# Patient Record
Sex: Male | Born: 1955 | Race: White | Hispanic: No | Marital: Married | Smoking: Never smoker
Health system: Southern US, Community
[De-identification: ages and names within clinical notes are randomized; demographics above are authoritative.]

## PROBLEM LIST (undated history)

## (undated) DIAGNOSIS — F419 Anxiety disorder, unspecified: Secondary | ICD-10-CM

## (undated) DIAGNOSIS — I739 Peripheral vascular disease, unspecified: Secondary | ICD-10-CM

## (undated) DIAGNOSIS — R609 Edema, unspecified: Secondary | ICD-10-CM

## (undated) DIAGNOSIS — E039 Hypothyroidism, unspecified: Secondary | ICD-10-CM

## (undated) DIAGNOSIS — E669 Obesity, unspecified: Secondary | ICD-10-CM

## (undated) DIAGNOSIS — M1990 Unspecified osteoarthritis, unspecified site: Secondary | ICD-10-CM

## (undated) DIAGNOSIS — E119 Type 2 diabetes mellitus without complications: Secondary | ICD-10-CM

## (undated) HISTORY — DX: Obesity, unspecified: E66.9

## (undated) HISTORY — DX: Type 2 diabetes mellitus without complications: E11.9

## (undated) HISTORY — PX: BARIATRIC SURGERY: SHX1103

## (undated) HISTORY — PX: ABDOMINOPLASTY/PANNICULECTOMY: SHX5578

## (undated) HISTORY — DX: Unspecified osteoarthritis, unspecified site: M19.90

## (undated) HISTORY — DX: Edema, unspecified: R60.9

## (undated) HISTORY — DX: Peripheral vascular disease, unspecified: I73.9

---

## 2000-11-26 ENCOUNTER — Encounter: Admission: RE | Admit: 2000-11-26 | Discharge: 2001-02-24 | Payer: Self-pay | Admitting: Pulmonary Disease

## 2003-08-16 ENCOUNTER — Inpatient Hospital Stay (HOSPITAL_COMMUNITY): Admission: EM | Admit: 2003-08-16 | Discharge: 2003-08-20 | Payer: Self-pay | Admitting: Emergency Medicine

## 2004-01-18 ENCOUNTER — Ambulatory Visit (HOSPITAL_COMMUNITY): Admission: RE | Admit: 2004-01-18 | Discharge: 2004-01-18 | Payer: Self-pay | Admitting: Surgical Oncology

## 2006-03-07 ENCOUNTER — Ambulatory Visit (HOSPITAL_COMMUNITY): Admission: RE | Admit: 2006-03-07 | Discharge: 2006-03-07 | Payer: Self-pay | Admitting: Pulmonary Disease

## 2006-06-11 ENCOUNTER — Ambulatory Visit (HOSPITAL_COMMUNITY): Admission: RE | Admit: 2006-06-11 | Discharge: 2006-06-11 | Payer: Self-pay | Admitting: Pulmonary Disease

## 2006-07-09 ENCOUNTER — Encounter: Admission: RE | Admit: 2006-07-09 | Discharge: 2006-07-09 | Payer: Self-pay | Admitting: General Surgery

## 2007-08-06 ENCOUNTER — Encounter: Admission: RE | Admit: 2007-08-06 | Discharge: 2007-08-06 | Payer: Self-pay | Admitting: General Surgery

## 2007-10-27 ENCOUNTER — Encounter (INDEPENDENT_AMBULATORY_CARE_PROVIDER_SITE_OTHER): Payer: Self-pay | Admitting: General Surgery

## 2007-10-27 ENCOUNTER — Ambulatory Visit (HOSPITAL_COMMUNITY): Admission: RE | Admit: 2007-10-27 | Discharge: 2007-10-27 | Payer: Self-pay | Admitting: General Surgery

## 2010-03-26 ENCOUNTER — Encounter: Payer: Self-pay | Admitting: *Deleted

## 2010-07-18 NOTE — Op Note (Signed)
[name], [name]            ACCOUNT NO.:  [id number]   MEDICAL RECORD NO.:  [id number]          PATIENT TYPE:  AMB   LOCATION:  SDS                          FACILITY:  [facility]   PHYSICIAN:  Gabrielle Dare. Janee Morn, M.D.DATE OF BIRTH:  06/26/1955   DATE OF PROCEDURE:  10/27/2007  DATE OF DISCHARGE:  10/27/2007                               OPERATIVE REPORT   PREOPERATIVE DIAGNOSIS:  Chronic abdominal wound, status post recurrent  ventral hernia repair with mesh.   POSTOPERATIVE DIAGNOSES:  1. Chronic abdominal wound status post recurrent ventral hernia repair      with mesh.  2. 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   PROCEDURE:  Incision and drainage and debridement of chronic abdominal  wound 6 sections with cultures taken.   SURGEON:  Gabrielle Dare. Janee Morn, MD   ANESTHESIA:  General.   HISTORY OF PRESENT ILLNESS:  [name] is 55 year old gentleman who  have been following in the office for a chronic seroma after abdominal  hernia repair with mesh.  He has a history of a gastric bypass surgery  done at an outside location followed by panniculectomy and abdominal  incisional hernia repair.  He then had repair of his recurrent hernia  with mesh.  He has had chronic draining cerumen.  He presents today for  abdominal wound exploration and debridement.   PROCEDURE IN DETAIL:  Informed consent was obtained.  The patient was  identified in the preop holding area.  Of note, he had developed two  more places on the right lower quadrant with more induration and he and  his wife have circled 6 areas on his anterior abdominal wall of concern.  This is 5 areas in addition to his main chronic sinus that I have been  following.  He received intravenous antibiotics.  He was brought to the  operating room.  General anesthesia was administered by the anesthesia  staff.  His abdomen was prepped and draped in sterile fashion.  We  started at his right lateral most wounds and this had some  palpable  indurated area.  A transverse incision was made over this.  There was  minimal fluid and there appeared to be some suture granuloma extending  down to the scar tissue above this mesh.  This was minimally debrided  and hemostasis was obtained with Bovie cautery and moved onto the next  area which was more erythematous and indurated.  This area was excised  down in elliptical fashion with underlying induration excised down using  Bovie cautery.  This was sent to pathology.  This was also consistent  with a chronic suture granuloma.  There was no significant drainage and  it appeared benign where it extended down to the scar tissue overlying  the mesh.  None of the mesh was exposed.  The wound was copiously  irrigated and hemostasis was obtained with Bovie cautery.  Next, he had  a small palpable area cephalad and just lateral to his main draining  infraumbilical region and again his umbilicus has been removed.  This  area was opened with suture and there was a large vein with  some chronic  inflammation.  The vein was controlled with suture ligation with 2-0  Vicryl and the area was debrided.  Next, his chronic draining sinus was  evaluated and it had some cloudy fluid.  This was sent for anaerobic and  aerobic cultures.  The area was then widely excised with an elliptical  excision of tissue and this entered down into some more hardened scar  tissue extending down towards the mesh but then seemed to stop above the  mesh.  The mesh was not exposed.  This area was debrided of all this  abnormal tissue and this was sent to pathology for evaluation.  Next, on  the left side of the wound, there was a small palpable area.  This was  incised and again there was a large vein with some surrounding small  scar tissue.  The vein was controlled with suture ligation as previously  and there was no further evidence of infection or other abnormality.  Next, more lateral on the left was another  area of some induration.  This was similarly incised and debrided of some scar tissue and likely a  stitch granuloma.  All wounds were copiously irrigated.  Hemostasis was  again ensured with Bovie cautery.  The wounds were then packed with  saline-soaked gauze and wet-to-dry dressings which the patient feels  comfortable doing with his wife at home.  He tolerated the procedure  well.  Sponge, needle, and instrument counts were all correct.  He was  taken to recovery room in stable condition.  There were no apparent  complications.      Gabrielle Dare Janee Morn, M.D.  Electronically Signed     BET/MEDQ  D:  10/27/2007  T:  10/27/2007  Job:  [id number]   cc:   Ramon Dredge L. Juanetta Gosling, M.D.

## 2010-07-21 NOTE — Group Therapy Note (Signed)
NAME:  [name], [name]                      ACCOUNT NO.:  [id number]   MEDICAL RECORD NO.:  [id number]                   PATIENT TYPE:  INP   LOCATION:  A227                                 FACILITY:  [facility]   PHYSICIAN:  Edward L. Juanetta Gosling, M.D.             DATE OF BIRTH:  05/02/1955   DATE OF PROCEDURE:  DATE OF DISCHARGE:  08/20/2003                                   PROGRESS NOTE   PROBLEM:  1. Proteus urinary tract infection.  2. Status post gastric bypass.  3. Diabetes.   SUBJECTIVE:  Mr. Dupree says that he feels much better.  He had a little  bit of fever yesterday afternoon, but says he is much better now and he  really would like to go home.   PHYSICAL EXAMINATION:  VITAL SIGNS:  Temperature 98.4; pulse 71;  respirations 20; blood sugar 108; blood pressure 127/85.  CHEST:  Clear.  ABDOMEN:  Soft.   ASSESSMENT:  I am going to try to evaluate the situation with Cipro in  patients with gastric bypass.  If it is well absorbed, I will plan to go  ahead and let him go home later today.      ___________________________________________                                            Oneal Deputy. Juanetta Gosling, M.D.   ELH/MEDQ  D:  08/20/2003  T:  08/20/2003  Job:  [id number]

## 2010-07-21 NOTE — Procedures (Signed)
NAME:  [name], [name]            ACCOUNT NO.:  [id number]   MEDICAL RECORD NO.:  [id number]          PATIENT TYPE:  OUT   LOCATION:  RESP                          FACILITY:  [facility]   PHYSICIAN:  Edward L. Juanetta Gosling, M.D.DATE OF BIRTH:  22-Feb-1956   DATE OF PROCEDURE:  06/11/2006  DATE OF DISCHARGE:                            PULMONARY FUNCTION TEST   1. Spirometry shows new no evidence of a ventilatory defect and no      airflow obstruction.  2. Lung volumes are normal.  3. DLCO is mildly reduced.  4. Arterial blood gases are normal.      Edward L. Juanetta Gosling, M.D.  Electronically Signed     ELH/MEDQ  D:  06/12/2006  T:  06/12/2006  Job:  [id number]

## 2010-07-21 NOTE — Procedures (Signed)
NAME:  [name], [name]                      ACCOUNT NO.:  [id number]   MEDICAL RECORD NO.:  [id number]                   PATIENT TYPE:  INP   LOCATION:  A227                                 FACILITY:  [facility]   PHYSICIAN:  Willa Rough, M.D.                  DATE OF BIRTH:  [birth year]   DATE OF PROCEDURE:  DATE OF DISCHARGE:                                  ECHOCARDIOGRAM   The patient has had syncope and this study is done for further evaluation.   2-D ECHOCARDIOGRAM:  Aorta:  35 mm.  Aortic valve:  Normal.  Left atrium:  40 mm.  Mitral valve:  Normal.  Left ventricle:  End-diastolic dimension 43  mm and systolic dimension 30 mm.  Wall thickness is increased at 14 mm.  Wall motion is normal.  The ejection fraction is 60%.  Right ventricle:  Normal.  Tricuspid valve:  Normal.  Pulmonic valve:  Not well seen.  Pericardia effusion:  There is no significant pericardial effusion.   Doppler analysis revealed no significant abnormalities.   IMPRESSION:  This study shows that there is mild to moderate left  ventricular hypertrophy.  There is normal overall wall motion.  There are no  significant valvular abnormalities.      ___________________________________________                                            Willa Rough, M.D.   JK/MEDQ  D:  08/18/2003  T:  08/18/2003  Job:  [id number]   cc:   [facility]

## 2010-07-21 NOTE — Group Therapy Note (Signed)
NAME:  BOWIE, DELIA                      ACCOUNT NO.:  [id number]   MEDICAL RECORD NO.:  [id number]                   PATIENT TYPE:  INP   LOCATION:  A227                                 FACILITY:  [facility]   PHYSICIAN:  Edward L. Juanetta Gosling, M.D.             DATE OF BIRTH:  [birth year]   DATE OF PROCEDURE:  DATE OF DISCHARGE:                                   PROGRESS NOTE   SUBJECTIVE:  Mr. Barbier has had more problems with fever overnight.  His  temperature has been as high as 102.  He has had some chills.   OBJECTIVE:  VITAL SIGNS:  Temperature now is 101.3, pulse 95, respirations  20, blood sugar 125, blood pressure 115/68.  CHEST:  Fairly clear.  HEART:  Regular.  GENERAL:  He has no other symptoms.  ABDOMEN:  His abdominal wound looks pretty good.  I do not think it is the  source of fever.   LABORATORY DATA:  His blood cultures are both negative so far.  Urine  culture is pending.   PLAN:  We will plan to continue treatments.  He was scheduled for a  Cardiolite stress test today.  I am going to cancel that.      ___________________________________________                                            Oneal Deputy. Juanetta Gosling, M.D.   ELH/MEDQ  D:  08/18/2003  T:  08/18/2003  Job:  [id number]

## 2010-07-21 NOTE — Discharge Summary (Signed)
NAME:  [name], [name]                      ACCOUNT NO.:  [id number]   MEDICAL RECORD NO.:  [id number]                   PATIENT TYPE:  INP   LOCATION:  A227                                 FACILITY:  [facility]   PHYSICIAN:  Edward L. Juanetta Gosling, M.D.             DATE OF BIRTH:  [birth year]   DATE OF ADMISSION:  08/16/2003  DATE OF DISCHARGE:                                 DISCHARGE SUMMARY   DISCHARGE DIAGNOSES:  1. Proteus mirabilis urinary tract infection.  2. Syncope secondary to #1.  3. Status post gastric bypass surgery.  4. Hypokalemia.  5. Diabetes.  6. Depression.  7. Recent abdominal surgery with slow healing.   HISTORY:  [name] has undergone a gastric bypass surgery and has lost  well over 100 pounds.  He was in his usual state of fairly good health after  that.  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  He developed, however, a sensation of frequency of urination on  the day prior to admission, and then he had a syncopal episode. He was  brought to the emergency room where he was noted to have an elevated white  count in his urine, appeared to have a urinary tract infection and was  admitted because of that.   PHYSICAL EXAMINATION:  GENERAL:  His physical examination shows a well-  developed, well-nourished male, in no acute distress.  VITAL SIGNS: Temperature 101.6, pulse 107, respirations 20, blood pressure  122/67.  Height 69 inches.  Weight 245.  HEENT:  Pupils react to light and accommodation.  Nose and throat are clear.  CHEST:  Fairly clear.  HEART:  Regular.  ABDOMEN:  He has an abdominal surgery scar on the right side with an open  area.  NEUROLOGIC:  He is intact.   LABORATORY DATA:  White count 12,600, hemoglobin 15.1, BUN 9, creatinine  1.1, potassium 3.4.  Urine showed 11-20 white blood cells and many bacteria,  many red cells.   HOSPITAL COURSE:  It was felt that he had syncope from vagal reaction from  his  urinary tract infection.  However, he did have cardiology consultation  because of concerns.  He was to undergo a Cardiolite stress test, but his  temperature went back up to about 102.  The stress test was cancelled at  that point.  It is going to be done as an outpatient.  He got better over  the next several days. He was placed on Cipro intravenously and had fairly  slow  resolution of his temperature.  By the time of discharge, he was much  improved.  He is discharge home on Cipro 750 mg b.i.d. x10 days.  He is  going to use Tylenol and Ibuprofen alternating for fever, and he is going to  follow up in my office.     ___________________________________________  Edward L. Juanetta Gosling, M.D.   ELH/MEDQ  D:  08/20/2003  T:  08/21/2003  Job:  [id number]

## 2010-07-21 NOTE — Consult Note (Signed)
NAME:  [name], [name]                      ACCOUNT NO.:  [id number]   MEDICAL RECORD NO.:  [id number]                   PATIENT TYPE:  INP   LOCATION:  A227                                 FACILITY:  [facility]   PHYSICIAN:  Cecil Cranker, M.D.             DATE OF BIRTH:  [birth year]   DATE OF CONSULTATION:  08/17/2003  DATE OF DISCHARGE:                                   CONSULTATION   PRIMARY CARE PHYSICIAN:  Dr. Shaune Pollack.   CHIEF COMPLAINT:  Syncope.   HISTORY OF PRESENT ILLNESS:  Mr. Bautch is a 55 year old gentleman with  past medical history significant for diabetes mellitus and obesity status  post gastric bypass surgery. He presented to the hospital after a syncopal  episode. He states that while taking a shower he had a near syncopal episode  proceeded by flushed and diaphoretic feeling. He noted no chest pain or  shortness of breath. This resolved quickly. He stood back up in the shower  and shortly after that had a late syncope episode and woke up in the  bathroom floor. He is unclear how long he out or if he hit his head on the  floor or not. He assumes that he did. He states over the past several days  he has felt somewhat more fatigued than usual. He also noted the day of the  syncope episode to have increased urinary frequency where he got out of the  bed approximately seven times to use the restroom. He denied any dysuria or  hematuria or urgency. 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 He called his  wife. She was on duty. She came over to evaluate him. He had another episode  of diaphoresis and flushed feeling, and his heart rate was noted to drop  from the 110s to the 50s, and his blood pressure was 80 systolic. With this  episode, this resolved upon lying supine. He denies any previous cardiac  evaluation or history.   PAST MEDICAL HISTORY:  1. Diabetes mellitus, prior to gastric bypass controlled with oral     medication, post bypass surgery diet  controlled.  2. Obesity status post gastric bypass in November 2002 at [facility]  3. Status post hernia repair in April of 2005.  4. Status post cholecystectomy in November 2002.  5. Hypertension, resolved post surgery.  6. Depression which resolved post bypass surgery.   MEDICATIONS:  Medications prior to admission:  1. Multivitamin daily.  2. Potassium supplement, Micro-K 40 mEq q.d. (patient states he has not     taken this in one week).   Medications in the hospital include:  1. Cipro 400 mg IV q.12h.  2. KCl 20 mEq t.i.d.  3. Normal saline at 125 cc per hour.   ALLERGIES:  No known drug allergies.   SOCIAL HISTORY:  Mr. Marcucci lives in [place] with his wife. He has  three adopted children. He  works as an Advertising copywriter. He denies any history  of tobacco or alcohol abuse. Denies any illicit drug use.   FAMILY HISTORY:  Mother deceased at 85 years ago secondary to a ventricular  blow out. She also had a history of rheumatoid arthritis and embolic CVA.  Father deceased at 75 years old secondary to emphysema. He did have a  history of a cardiomyopathy, unclear of all the details from this. He has  one brother who was living with no known coronary disease.   REVIEW OF SYSTEMS:  CONSTITUTIONAL:  Positive for fever upon admission. The  patient denies any chills. He did have some diaphoresis with this episode.  HEENT:  No headaches. No vertigo or lightheadedness. No changes in his  vision or hearing. He does wear reading glasses. SKIN:  No rashes or lesions  have been noted. CARDIOPULMONARY:  No chest pain or shortness of breath. No  dyspnea on exertion. No orthopnea. No PND. Occasional pedal edema and  pre/syncope per HPI. GENITOURINARY:  Frequency as noted above. No hematuria  or dysuria. NEUROPSYCH:  Generalized weakness. No numbness. No depression or  anxiety. MUSCULOSKELETAL:  No muscle aches or joint aches. GASTROINTESTINAL:  No nausea, vomiting. No bright red  blood per rectum or melena noted. No  abdominal pain or GERD symptoms. All other systems were negative as reviewed  except per HPI.   PHYSICAL EXAMINATION:  VITAL SIGNS:  Temperature 101.8, pulse 103,  respirations 20, blood pressure 118/69, weight 245 pounds. Telemetry:  Sinus  tachycardia in the 110s with no events noted.  GENERAL:  Well-developed, well-nourished male in no acute distress with some  sweating.  HEENT:  Normocephalic, atraumatic. Pupils are equal, round, and reactive to  light. Sclerae are clear.  NECK:  Reveals no jugular venous distention and no carotid bruits. No  lymphadenopathy is noted.  CARDIOVASCULAR:  Tachycardic, regular rhythm, positive S4, no murmurs are  noted. Femoral pulses intact bilaterally with no bruits.  LUNGS:  Clear to auscultation bilaterally without wheezes, rales, or  rhonchi.  SKIN: No rashes are noted on skin.  ABDOMEN:  Obese, soft, nontender with active bowel sounds. No  hepatosplenomegaly is noted.  GENITOURINARY/RECTAL:  Deferred.  EXTREMITIES:  No clubbing, cyanosis, or edema is noted. He does have intact  pulses bilaterally.  MUSCULOSKELETAL:  No joint deformity or effusions are noted.  NEUROLOGICAL:  He is alert and oriented x3. Cranial nerves exam is grossly  intact.   LABORATORY DATA:  White blood cells 12.6, hemoglobin 15.1, hematocrit 43.4,  platelets 135. Does have a left shift. Sodium 135, potassium 3.4, chloride  101, CO2 28, BUN 9, creatinine 1.1, glucose 150. He had one set of point of  care markers that were negative x1 as far as his myoglobin and troponin.  Calcium was 8.6. Urinalysis revealed urinary tract infection. Chest x-ray  was no active disease per ER report. Electrocardiogram reveals sinus  tachycardia at 102 beats per minute with left axis deviation, normal PR  interval, QRS duration and QTC. He did have some minimal ST depression in  lead II, otherwise nonspecific changes. No hypertrophy is  noted.  IMPRESSION AND PLAN:  1. Syncope of unclear etiology. Could be a vasovagal etiology secondary to     his urinary tract infection or his diabetes and low potassium level. Will     further evaluate for cardiac etiology with a 2-D echocardiogram and     adenosine Cardiolite. Will continue to be monitored on telemetry. We will  also check some orthostatic vital signs as well.  2. Hypokalemia. Replacement per primary team.  3. Diabetes mellitus. Accu-Cheks and management per primary team.  4. Urinary tract infection, currently treated with Cipro per the primary     team.   The patient was interviewed and examined by Dr. Glennon Hamilton. He agrees with  the above assessment and plan. We appreciate this consult and will be happy  to follow along with you during this patient's hospitalization.     ________________________________________  ___________________________________________  Jae Dire, P.A. [facility]                      Cecil Cranker, M.D.   AB/MEDQ  D:  08/17/2003  T:  08/17/2003  Job:  [id number]

## 2010-07-21 NOTE — H&P (Signed)
NAME:  [name], [name]                      ACCOUNT NO.:  [id number]   MEDICAL RECORD NO.:  [id number]                   PATIENT TYPE:  INP   LOCATION:  A227                                 FACILITY:  [facility]   PHYSICIAN:  Edward L. Juanetta Gosling, M.D.             DATE OF BIRTH:  April 25, 1955   DATE OF ADMISSION:  08/16/2003  DATE OF DISCHARGE:                                HISTORY & PHYSICAL   REASON FOR ADMISSION:  Syncope.   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  He says that he had been doing  fairly well.  He had been working a lot, but had woke up several times  during the night on the night of admission to go to the bathroom.  He did  not have any other urinary symptoms other than frequency.  He had no  dysuria.  He was taking a shower.  He had a near syncopal episode, and then  he had another episode where he did pass out.  He called his wife who is  also an EMT.  He and her partner who were working came to the house, and he  was noted to be somewhat tachycardic, but then his heart rate dropped and  his blood pressure dropped.  He became diaphoretic and said he did not feel  well, and he was brought to the emergency room for evaluation.   PAST MEDICAL HISTORY:  Significant for obesity surgery.  He has noninsulin  dependent diabetes.  He has had a hernia repair done about three months ago.   SOCIAL HISTORY:  He works, as mentioned, as a Radiation protection practitioner.  He does not smoke.  He does not drink any alcohol.  He is married and lives at home with his  wife.   REVIEW OF SYSTEMS:  Except as mentioned, he has lost weight.  He does have  an ulcerated area on his lower abdomen related to his hernia surgery, but  otherwise is essentially negative.  He did not know of any fever, but he did  have fever when he came to the emergency room.  He had no chest pain with  this episode.  No shortness of breath.  No cough, no  sputum production.   PHYSICAL EXAMINATION:  GENERAL:  A well-developed, well-nourished male who  is in no acute distress now.  VITAL SIGNS:  Temperature 101.6, pulse 107, respirations 20, blood pressure  122/67.  Height 69 inches.  Weight 245.  HEENT:  His pupils are reactive to light and accommodation.  Nose and throat  are clear.  CHEST:  Fairly clear without wheezes.  HEART:  Regular.  ABDOMEN:  Soft.  He has a recent abdominal surgical scar on the right side.  He has an open area.  NEUROLOGIC:  CNS now is grossly intact with no focal abnormalities.   LABORATORY DATA:  White count 12,600, hemoglobin 15.1, platelets 135,  glucose 150, BUN 9, creatinine 1.1.  Potassium was 3.4.  He does say that he  has not been taking his potassium for the last week or so.  Additional  cardiac markers are negative.  Urinalysis shows trace ketones, small amount  of blood, negative nitrite, small amount of leukocytes, 11-20 white cells  per high-powered field.  Many bacteria, many red cells.  Culture, of course,  is pending.   ASSESSMENT:  He has syncope.  I am concerned that he did have what appeared  to be a fairly severe vagal reaction, and has a urinary tract infection. I  am somewhat surprised that he has had syncope from this.  With his changes  in heart rate, etc., I am going to go ahead and ask for a [facility]  Consultation, and I am going to continue with his other treatments in the  meantime.     ___________________________________________                                         Oneal Deputy Juanetta Gosling, M.D.   ELH/MEDQ  D:  08/17/2003  T:  08/17/2003  Job:  [id number]

## 2010-07-21 NOTE — Group Therapy Note (Signed)
NAME:  [name], [name]                      ACCOUNT NO.:  [id number]   MEDICAL RECORD NO.:  [id number]                   PATIENT TYPE:  INP   LOCATION:  A227                                 FACILITY:  [facility]   PHYSICIAN:  Edward L. Juanetta Gosling, M.D.             DATE OF BIRTH:  1955-10-08   DATE OF PROCEDURE:  08/19/2003  DATE OF DISCHARGE:                                   PROGRESS NOTE   PROBLEM LIST:  1. Urinary tract infection, status post abdominal surgery with draining     wound.  2. Massive obesity, status post obesity surgery.  3. Diabetes.   SUBJECTIVE:  [name] says he is doing okay.  He has no new complaints.   OBJECTIVE:  VITAL SIGNS:  Temperature 99.7, pulse 73, respirations 20, blood  sugar 93, blood pressure 120/72.  GENERAL:  He looks much better today and says he is feeling better.  ABDOMEN:  He is having more drainage out of the abdominal wound.   LABORATORY DATA AND X-RAY FINDINGS:  White count 2500, hemoglobin 12.5,  platelets 82,000.  Electrolytes are normal.  CO2 33.  Calcium 7.7.  Urine is  growing Proteus mirabilis and it is sensitive to Cipro and he does seem to  have now become afebrile.   ASSESSMENT:  I suspect that what has happened that he simply has a  compromised immune system because of his obesity surgery and it is taking  him longer to get better than usual.  He does seem to have perhaps turned  the corner and we will see how he does the rest of the day.      ___________________________________________                                            Oneal Deputy. Juanetta Gosling, M.D.   ELH/MEDQ  D:  08/19/2003  T:  08/19/2003  Job:  [id number]

## 2013-07-29 ENCOUNTER — Encounter (INDEPENDENT_AMBULATORY_CARE_PROVIDER_SITE_OTHER): Payer: Self-pay | Admitting: *Deleted

## 2013-07-29 DIAGNOSIS — R109 Unspecified abdominal pain: Secondary | ICD-10-CM

## 2013-08-12 ENCOUNTER — Encounter (INDEPENDENT_AMBULATORY_CARE_PROVIDER_SITE_OTHER): Payer: Self-pay | Admitting: *Deleted

## 2013-08-18 ENCOUNTER — Encounter (INDEPENDENT_AMBULATORY_CARE_PROVIDER_SITE_OTHER): Payer: Self-pay | Admitting: *Deleted

## 2013-08-18 DIAGNOSIS — M1990 Unspecified osteoarthritis, unspecified site: Secondary | ICD-10-CM | POA: Insufficient documentation

## 2013-08-18 DIAGNOSIS — R609 Edema, unspecified: Secondary | ICD-10-CM | POA: Insufficient documentation

## 2013-08-18 DIAGNOSIS — E669 Obesity, unspecified: Secondary | ICD-10-CM | POA: Insufficient documentation

## 2013-08-20 ENCOUNTER — Telehealth (INDEPENDENT_AMBULATORY_CARE_PROVIDER_SITE_OTHER): Payer: Self-pay | Admitting: *Deleted

## 2013-08-20 ENCOUNTER — Ambulatory Visit (INDEPENDENT_AMBULATORY_CARE_PROVIDER_SITE_OTHER): Payer: BC Managed Care – PPO | Admitting: Internal Medicine

## 2013-08-20 ENCOUNTER — Encounter (INDEPENDENT_AMBULATORY_CARE_PROVIDER_SITE_OTHER): Payer: Self-pay | Admitting: Internal Medicine

## 2013-08-20 ENCOUNTER — Other Ambulatory Visit (INDEPENDENT_AMBULATORY_CARE_PROVIDER_SITE_OTHER): Payer: Self-pay | Admitting: *Deleted

## 2013-08-20 VITALS — BP 146/80 | HR 64 | Temp 97.4°F | Ht 68.0 in | Wt 317.6 lb

## 2013-08-20 DIAGNOSIS — R6881 Early satiety: Secondary | ICD-10-CM | POA: Insufficient documentation

## 2013-08-20 DIAGNOSIS — R10816 Epigastric abdominal tenderness: Secondary | ICD-10-CM

## 2013-08-20 DIAGNOSIS — Z1211 Encounter for screening for malignant neoplasm of colon: Secondary | ICD-10-CM

## 2013-08-20 NOTE — Telephone Encounter (Signed)
Patient needs trilyte 

## 2013-08-20 NOTE — Progress Notes (Signed)
Subjective:     Patient ID: [name], male   DOB: [birth year], 58 y.o.   MRN: [id number]   HPI Referred to our office by Dr. Juanetta GoslingHawkins for screening colonoscopy. Over the past 2 months, he tell me he says he becomes full. He has tenderness over and under his xyphoid process. No nausea or vomiting.  He has early satiety. H has a crampy sensation to his epigastric region while eating. Appetite is good except for early satiety. He is eating more frequently in smaller amounts. No dysphagia. No acid reflux. BMs x 1-2 a day. No melena or bright red rectal bleeding.   [facility]  07/29/2013 WBC 4.5, H and H 15.0 and 43.2, platelet ct 156, Total bili 1.0, ALP 85, AST 26, ALT 25,   10/2007 Dr. Violeta GelinasBurke [employer] OPERATIVE REPORT  PREOPERATIVE DIAGNOSIS: Chronic abdominal wound, status post recurrent  ventral hernia repair with mesh.  He has had 4 repairs since 2005 [facility].  Review of Systems Past Medical History  Diagnosis Date  . Obesity   . Arthritis   . Edema   . DM (diabetes mellitus)     2001    Past Surgical History  Procedure Laterality Date  . Bariatric surgery      2002    No Known Allergies  No current outpatient prescriptions on file prior to visit.   No current facility-administered medications on file prior to visit.        Objective:   Physical Exam  Filed Vitals:   08/20/13 1107  BP: 146/80  Pulse: 64  Temp: 97.4 F (36.3 C)  Height: 5\' 8"  (1.727 m)  Weight: 317 lb 9.6 oz (144.062 kg)   Alert and oriented. Skin warm and dry. Oral mucosa is moist.   . Sclera anicteric, conjunctivae is pink. Thyroid not enlarged. No cervical lymphadenopathy. Lungs clear. Heart regular rate and rhythm.  Abdomen is soft. Bowel sounds are positive. No hepatomegaly. No abdominal masses felt. No tenderness.  No edema to lower extremities.      Assessment:    Screening colonoscopy. PUD needs to be ruled  out.    (Patient would like to proceed with an EGD) Plan:     EGD/Colonoscopy. The risks and benefits such as perforation, bleeding, and infection were reviewed with the patient and is agreeable.

## 2013-08-20 NOTE — Patient Instructions (Signed)
Colonoscopy/EGD. 

## 2013-08-27 MED ORDER — PEG 3350-KCL-NA BICARB-NACL 420 G PO SOLR: 4000.0000 mL | Freq: Once | ORAL | Status: DC

## 2013-09-10 ENCOUNTER — Encounter (HOSPITAL_COMMUNITY): Payer: Self-pay | Admitting: Pharmacy Technician

## 2013-09-30 ENCOUNTER — Encounter (HOSPITAL_COMMUNITY)
Admission: RE | Disposition: A | Discharge status: Home / Self Care | Payer: Self-pay | Source: Ambulatory Visit | Attending: Internal Medicine

## 2013-09-30 ENCOUNTER — Telehealth (INDEPENDENT_AMBULATORY_CARE_PROVIDER_SITE_OTHER): Payer: Self-pay | Admitting: *Deleted

## 2013-09-30 ENCOUNTER — Encounter (HOSPITAL_COMMUNITY): Payer: Self-pay

## 2013-09-30 ENCOUNTER — Ambulatory Visit (HOSPITAL_COMMUNITY)
Admission: RE | Admit: 2013-09-30 | Discharge: 2013-09-30 | Disposition: A | Discharge status: Home / Self Care | Payer: BC Managed Care – PPO | Source: Ambulatory Visit | Attending: Internal Medicine | Admitting: Internal Medicine

## 2013-09-30 DIAGNOSIS — K259 Gastric ulcer, unspecified as acute or chronic, without hemorrhage or perforation: Secondary | ICD-10-CM

## 2013-09-30 DIAGNOSIS — Z1211 Encounter for screening for malignant neoplasm of colon: Secondary | ICD-10-CM | POA: Insufficient documentation

## 2013-09-30 DIAGNOSIS — R1013 Epigastric pain: Secondary | ICD-10-CM

## 2013-09-30 DIAGNOSIS — R6881 Early satiety: Secondary | ICD-10-CM

## 2013-09-30 DIAGNOSIS — Z6841 Body Mass Index (BMI) 40.0 and over, adult: Secondary | ICD-10-CM | POA: Insufficient documentation

## 2013-09-30 DIAGNOSIS — E669 Obesity, unspecified: Secondary | ICD-10-CM | POA: Insufficient documentation

## 2013-09-30 DIAGNOSIS — Z9884 Bariatric surgery status: Secondary | ICD-10-CM | POA: Insufficient documentation

## 2013-09-30 DIAGNOSIS — K644 Residual hemorrhoidal skin tags: Secondary | ICD-10-CM | POA: Insufficient documentation

## 2013-09-30 DIAGNOSIS — K257 Chronic gastric ulcer without hemorrhage or perforation: Secondary | ICD-10-CM

## 2013-09-30 HISTORY — PX: ESOPHAGOGASTRODUODENOSCOPY: SHX5428

## 2013-09-30 HISTORY — PX: COLONOSCOPY: SHX5424

## 2013-09-30 SURGERY — COLONOSCOPY
Anesthesia: Moderate Sedation

## 2013-09-30 MED ORDER — PANTOPRAZOLE SODIUM 40 MG PO TBEC: 40.0000 mg | DELAYED_RELEASE_TABLET | Freq: Two times a day (BID) | ORAL | Status: DC

## 2013-09-30 MED ORDER — SIMETHICONE 40 MG/0.6ML PO SUSP
ORAL | Status: DC | PRN
  Administered 2013-09-30: 14:00:00

## 2013-09-30 MED ORDER — BUTAMBEN-TETRACAINE-BENZOCAINE 2-2-14 % EX AERO
INHALATION_SPRAY | CUTANEOUS | Status: DC | PRN
  Administered 2013-09-30: 2 via TOPICAL

## 2013-09-30 MED ORDER — HYDROCODONE-ACETAMINOPHEN 5-300 MG PO TABS
1.0000 | ORAL_TABLET | Freq: Two times a day (BID) | ORAL | Status: DC | PRN
Start: 2013-09-30 — End: 2023-10-23

## 2013-09-30 MED ORDER — MIDAZOLAM HCL 5 MG/5ML IJ SOLN
INTRAMUSCULAR | Status: DC | PRN
  Administered 2013-09-30 (×3): 2 mg via INTRAVENOUS
  Administered 2013-09-30: 1 mg via INTRAVENOUS

## 2013-09-30 MED ORDER — MEPERIDINE HCL 50 MG/ML IJ SOLN
INTRAMUSCULAR | Status: DC | PRN
  Administered 2013-09-30 (×2): 25 mg via INTRAVENOUS

## 2013-09-30 MED ORDER — SODIUM CHLORIDE 0.9 % IV SOLN
INTRAVENOUS | Status: DC
  Administered 2013-09-30: 13:00:00 via INTRAVENOUS

## 2013-09-30 MED ORDER — MIDAZOLAM HCL 5 MG/5ML IJ SOLN
INTRAMUSCULAR | Status: AC
  Filled 2013-09-30: qty 10

## 2013-09-30 MED ORDER — MEPERIDINE HCL 50 MG/ML IJ SOLN
INTRAMUSCULAR | Status: AC
  Filled 2013-09-30: qty 1

## 2013-09-30 NOTE — Telephone Encounter (Signed)
[facility]. A prescription was written for Hydrocodone 5/300 mg , they do not stock this and ask if this would be okay to switch to the Hydrocodone 5/325 mg? Per Dr.Rehman may switch to Hydrocodone 3/325 mg. Melissa/[facility] was made aware.

## 2013-09-30 NOTE — Op Note (Signed)
EGD AND COLONOSCOPY PROCEDURE REPORT  PATIENT:  Daniel MemosMichael E Mayer  MR#:  [id number] Birthdate:  [birth year], 58 y.o., male Endoscopist:  Dr. Malissa HippoNajeeb U. Yissel Habermehl, MD Referred By:  Dr. Oneal DeputyEdward L. Juanetta GoslingHawkins, MD Procedure Date: 09/30/2013  Procedure:   EGD & Colonoscopy  Indications:  Patient is 58 year old Caucasian male who presents with postprandial epigastric pain and early satiety. He is status post bariatric surgery in 2002. He is undergoing diagnostic EGD and average risk screening colonoscopy. Patient takes one Aleve twice daily which is not listed in his medications.            Informed Consent:  The risks, benefits, alternatives & imponderables which include, but are not limited to, bleeding, infection, perforation, drug reaction and potential missed lesion have been reviewed.  The potential for biopsy, lesion removal, esophageal dilation, etc. have also been discussed.  Questions have been answered.  All parties agreeable.  Please see history & physical in medical record for more information.  Medications:  Demerol 50 mg IV Versed 7 mg IV Cetacaine spray topically for oropharyngeal anesthesia  EGD  Description of procedure:  The endoscope was introduced through the mouth and advanced to the second portion of the duodenum without difficulty or limitations. The mucosal surfaces were surveyed very carefully during advancement of the scope and upon withdrawal.  Findings:  Esophagus:  Mucosa of the esophagus was normal. GE junction was unremarkable. GEJ:  38 cm Stomach:  Stomach was empty. There was 10-12 mm ulcer covered with brown eschar in specks of blood but no active bleeding noted. This ulcer was located in the proximal stomach with linear extension laterally to the left.  It was covered with specks of blood but no . Mucosa and antrum and pyloric channel was normal. Duodenum:  Duodenal mucosa was unremarkable. Sutures noted in this area from prior surgery.  Therapeutic/Diagnostic  Maneuvers Performed:  None  COLONOSCOPY Description of procedure:  After a digital rectal exam was performed, that colonoscope was advanced from the anus through the rectum and colon to the area of the cecum, ileocecal valve and appendiceal orifice. The cecum was deeply intubated. These structures were well-seen and photographed for the record. From the level of the cecum and ileocecal valve, the scope was slowly and cautiously withdrawn. The mucosal surfaces were carefully surveyed utilizing scope tip to flexion to facilitate fold flattening as needed. The scope was pulled down into the rectum where a thorough exam including retroflexion was performed.  Findings:   Prep satisfactory. Normal mucosa of the radius segments of colon and rectum. Small hemorrhoids below the dentate line.  Therapeutic/Diagnostic Maneuvers Performed:  None  Complications:  None  Cecal Withdrawal Time:  10 minutes  Impression:   EGD findings; Gastric ulcer involving proximal stomach most likely secondary to NSAID use. Altered UGI tract secondary to prior prior surgery.  Colonoscopy findings; Normal colonoscopy except small external hemorrhoids.  Recommendations:  Patient advised not to take Aleve or other NSAIDs. Prescription given for hydrocodone/acetaminophen 5/300 by mouth twice a day when necessary; 40 doses. H. pylori serology. Patient will need followup EGD in 10-12 weeks to document complete healing of this ulcer. Next screening for CRC in 10 years.    [name] U  09/30/2013 3:14 PM  CC: Dr. Fredirick MaudlinHAWKINS,Daniel L, MD & Dr. Bonnetta BarryNo ref. provider found

## 2013-09-30 NOTE — H&P (Signed)
Daniel MemosMichael E Mayer is an 58 y.o. male.   Chief Complaint: Patient is here for EGD and colonoscopy. HPI: A [age]-year-old Caucasian male who presents with postprandial epigastric pain and cramps and early satiety. He is status post bariatric surgery in 2002. He denies nausea vomiting heartburn dysphagia melena or rectal bleeding. He is here for diagnostic EGD and screening colonoscopy. Family history is negative for CRC.  Past Medical History  Diagnosis Date  . Obesity   . Arthritis   . Edema   . DM (diabetes mellitus)     2001    Past Surgical History  Procedure Laterality Date  . Bariatric surgery      2002    Family History  Problem Relation Age of Onset  . Inflammatory bowel disease Maternal Uncle    Social History:  reports that he has never smoked. He does not have any smokeless tobacco history on file. He reports that he does not drink alcohol or use illicit drugs.  Allergies: No Known Allergies  Medications Prior to Admission  Medication Sig Dispense Refill  . Cholecalciferol (VITAMIN D3) 2000 UNITS TABS Take 2,000 Units by mouth 2 (two) times daily.      . niacin 100 MG tablet Take 100 mg by mouth at bedtime.      . niacin 250 MG tablet Take 250 mg by mouth 2 (two) times daily with a meal.      . Pediatric Multivit-Minerals-C (CHILDRENS MULTIVITAMIN PO) Take 2 tablets by mouth daily.      . polyethylene glycol-electrolytes (NULYTELY/GOLYTELY) 420 G solution Take 4,000 mLs by mouth once.  4000 mL  0  . potassium chloride (K-DUR) 10 MEQ tablet Take 20 mEq by mouth 2 (two) times daily.       Marland Kitchen. torsemide (DEMADEX) 20 MG tablet Take 20 mg by mouth as needed. Swelling/edema      . vitamin A 8000 UNIT capsule Take 16,000 Units by mouth 2 (two) times daily.      . vitamin B-12 (CYANOCOBALAMIN) 250 MCG tablet Take 250 mcg by mouth 2 (two) times daily.      . vitamin E 400 UNIT capsule Take 400 Units by mouth 2 (two) times daily.        No results found for this or any previous  visit (from the past 48 hour(s)). No results found.  ROS  Blood pressure 160/76, pulse 63, temperature 98.2 F (36.8 C), temperature source Oral, resp. rate 12, height 5\' 8"  (1.727 m), weight 304 lb (137.893 kg), SpO2 98.00%. Physical Exam  Constitutional:  Well-developed obese Caucasian male in NAD  HENT:  Mouth/Throat: Oropharynx is clear and moist.  Eyes: Conjunctivae are normal. No scleral icterus.  Neck: No thyromegaly present.  Cardiovascular: Normal rate, regular rhythm and normal heart sounds.   No murmur heard. Respiratory: Effort normal and breath sounds normal.  GI: Soft. He exhibits no mass. There is no tenderness.  Left inguinal hernia which is completely reducible hernia.  Musculoskeletal: Edema: 1-2+ pitting edema involving both legs.  Lymphadenopathy:    He has no cervical adenopathy.  Neurological: He is alert.  Skin: Skin is warm and dry.     Assessment/Plan Epigastric pain and early satiety in a patient prior beriatric surgery. Diagnostic EGD and average risk screening colonoscopy. Gabryelle Whitmoyer U 09/30/2013, 1:51 PM

## 2013-09-30 NOTE — Discharge Instructions (Signed)
Do not take Aleve or other NSAIDs. Pantoprazole 40 mg by mouth 30 minutes before breakfast and evening meal daily. Hydrocodone/acetaminophen 5/300 one tablet by mouth twice daily as needed for arthritic pain. Resume usual diet. No driving for 24 hours. Physician Will call with results of blood test.  Esophagogastroduodenoscopy Care After Refer to this sheet in the next few weeks. These instructions provide you with information on caring for yourself after your procedure. Your caregiver may also give you more specific instructions. Your treatment has been planned according to current medical practices, but problems sometimes occur. Call your caregiver if you have any problems or questions after your procedure.  HOME CARE INSTRUCTIONS  Do not eat or drink anything until the numbing medicine (local anesthetic) has worn off and your gag reflex has returned. You will know that the local anesthetic has worn off when you can swallow comfortably.  Do not drive for 12 hours after the procedure or as directed by your caregiver.  Only take medicines as directed by your caregiver. SEEK MEDICAL CARE IF:   You cannot stop coughing.  You are not urinating at all or less than usual. SEEK IMMEDIATE MEDICAL CARE IF:  You have difficulty swallowing.  You cannot eat or drink.  You have worsening throat or chest pain.  You have dizziness, lightheadedness, or you faint.  You have nausea or vomiting.  You have chills.  You have a fever.  You have severe abdominal pain.  You have black, tarry, or bloody stools. Document Released: 02/06/2012 Document Reviewed: 02/06/2012 [facility] Patient Information 2015 [place], [place]. This information is not intended to replace advice given to you by your health care provider. Make sure you discuss any questions you have with your health care provider. Colonoscopy, Care After These instructions give you information on caring for yourself after your procedure.  Your doctor may also give you more specific instructions. Call your doctor if you have any problems or questions after your procedure. HOME CARE  Do not drive for 24 hours.  Do not sign important papers or use machinery for 24 hours.  You may shower.  You may go back to your usual activities, but go slower for the first 24 hours.  Take rest breaks often during the first 24 hours.  Walk around or use warm packs on your belly (abdomen) if you have belly cramping or gas.  Drink enough fluids to keep your pee (urine) clear or pale yellow.  Resume your normal diet. Avoid heavy or fried foods.  Avoid drinking alcohol for 24 hours or as told by your doctor.  Only take medicines as told by your doctor. If a tissue sample (biopsy) was taken during the procedure:   Do not take aspirin or blood thinners for 7 days, or as told by your doctor.  Do not drink alcohol for 7 days, or as told by your doctor.  Eat soft foods for the first 24 hours. GET HELP IF: You still have a small amount of blood in your poop (stool) 2-3 days after the procedure. GET HELP RIGHT AWAY IF:  You have more than a small amount of blood in your poop.  You see clumps of tissue (blood clots) in your poop.  Your belly is puffy (swollen).  You feel sick to your stomach (nauseous) or throw up (vomit).  You have a fever.  You have belly pain that gets worse and medicine does not help. MAKE SURE YOU:  Understand these instructions.  Will watch your  condition.  Will get help right away if you are not doing well or get worse. Document Released: 03/24/2010 Document Revised: 02/24/2013 Document Reviewed: 10/27/2012 [facility] Patient Information 2015 [place], [place]. This information is not intended to replace advice given to you by your health care provider. Make sure you discuss any questions you have with your health care provider.

## 2013-10-01 LAB — H. PYLORI ANTIBODY, IGG: H Pylori IgG: <0.4 {ISR}

## 2013-10-02 ENCOUNTER — Encounter (HOSPITAL_COMMUNITY): Payer: Self-pay | Admitting: Internal Medicine

## 2013-12-28 ENCOUNTER — Other Ambulatory Visit (HOSPITAL_COMMUNITY): Payer: Self-pay | Admitting: Internal Medicine

## 2014-01-13 HISTORY — PX: HERNIA REPAIR: SHX51

## 2014-01-14 ENCOUNTER — Encounter (INDEPENDENT_AMBULATORY_CARE_PROVIDER_SITE_OTHER): Payer: Self-pay | Admitting: *Deleted

## 2014-02-03 ENCOUNTER — Other Ambulatory Visit (INDEPENDENT_AMBULATORY_CARE_PROVIDER_SITE_OTHER): Payer: Self-pay | Admitting: *Deleted

## 2014-02-03 ENCOUNTER — Encounter (INDEPENDENT_AMBULATORY_CARE_PROVIDER_SITE_OTHER): Payer: Self-pay | Admitting: *Deleted

## 2014-02-03 DIAGNOSIS — K259 Gastric ulcer, unspecified as acute or chronic, without hemorrhage or perforation: Secondary | ICD-10-CM

## 2014-02-08 ENCOUNTER — Telehealth (INDEPENDENT_AMBULATORY_CARE_PROVIDER_SITE_OTHER): Payer: Self-pay | Admitting: *Deleted

## 2014-02-08 NOTE — Telephone Encounter (Signed)
Referring MD/PCP: hawkins   Procedure: egd  Reason/Indication:  Gastric ulcer  Has patient had this procedure before?  Yes, 2015 -- epic  If so, when, by whom and where?    Is there a family history of colon cancer?    Who?  What age when diagnosed?    Is patient diabetic?   no      Does patient have prosthetic heart valve?  no  Do you have a pacemaker?  no  Has patient ever had endocarditis?  no  Has patient had joint replacement within last 12 months?  no  Does patient tend to be constipated or take laxatives?   Is patient on Coumadin, Plavix and/or Aspirin? no  Medications: see epic  Allergies: see epic  Medication Adjustment:   Procedure date & time: 03/10/14 at 1200

## 2014-02-09 NOTE — Telephone Encounter (Signed)
agree

## 2014-03-03 ENCOUNTER — Encounter (INDEPENDENT_AMBULATORY_CARE_PROVIDER_SITE_OTHER): Payer: Self-pay

## 2014-03-10 ENCOUNTER — Ambulatory Visit (HOSPITAL_COMMUNITY)
Admission: RE | Admit: 2014-03-10 | Discharge: 2014-03-10 | Disposition: A | Discharge status: Home / Self Care | Payer: BLUE CROSS/BLUE SHIELD | Source: Ambulatory Visit | Attending: Internal Medicine | Admitting: Internal Medicine

## 2014-03-10 ENCOUNTER — Encounter (HOSPITAL_COMMUNITY): Payer: Self-pay | Admitting: *Deleted

## 2014-03-10 ENCOUNTER — Encounter (HOSPITAL_COMMUNITY)
Admission: RE | Disposition: A | Discharge status: Home / Self Care | Payer: Self-pay | Source: Ambulatory Visit | Attending: Internal Medicine

## 2014-03-10 DIAGNOSIS — K259 Gastric ulcer, unspecified as acute or chronic, without hemorrhage or perforation: Secondary | ICD-10-CM

## 2014-03-10 DIAGNOSIS — E119 Type 2 diabetes mellitus without complications: Secondary | ICD-10-CM | POA: Diagnosis not present

## 2014-03-10 DIAGNOSIS — E669 Obesity, unspecified: Secondary | ICD-10-CM | POA: Diagnosis not present

## 2014-03-10 DIAGNOSIS — Z91018 Allergy to other foods: Secondary | ICD-10-CM | POA: Insufficient documentation

## 2014-03-10 DIAGNOSIS — Z9884 Bariatric surgery status: Secondary | ICD-10-CM | POA: Insufficient documentation

## 2014-03-10 DIAGNOSIS — Z881 Allergy status to other antibiotic agents status: Secondary | ICD-10-CM | POA: Insufficient documentation

## 2014-03-10 DIAGNOSIS — K2970 Gastritis, unspecified, without bleeding: Secondary | ICD-10-CM | POA: Insufficient documentation

## 2014-03-10 HISTORY — PX: ESOPHAGOGASTRODUODENOSCOPY: SHX5428

## 2014-03-10 LAB — GLUCOSE, CAPILLARY: Glucose-Capillary: 95 mg/dL (ref 70–99)

## 2014-03-10 SURGERY — EGD (ESOPHAGOGASTRODUODENOSCOPY)
Anesthesia: Moderate Sedation

## 2014-03-10 MED ORDER — SODIUM CHLORIDE 0.9 % IV SOLN
INTRAVENOUS | Status: DC
  Administered 2014-03-10: 12:00:00 via INTRAVENOUS

## 2014-03-10 MED ORDER — PANTOPRAZOLE SODIUM 40 MG PO TBEC: 40.0000 mg | DELAYED_RELEASE_TABLET | Freq: Every day | ORAL | Status: DC

## 2014-03-10 MED ORDER — MIDAZOLAM HCL 5 MG/5ML IJ SOLN
INTRAMUSCULAR | Status: AC
  Filled 2014-03-10: qty 10

## 2014-03-10 MED ORDER — MIDAZOLAM HCL 5 MG/5ML IJ SOLN
INTRAMUSCULAR | Status: DC | PRN
  Administered 2014-03-10 (×2): 2 mg via INTRAVENOUS

## 2014-03-10 MED ORDER — MEPERIDINE HCL 50 MG/ML IJ SOLN
INTRAMUSCULAR | Status: DC
Start: 2014-03-10 — End: 2014-03-10
  Filled 2014-03-10: qty 1

## 2014-03-10 MED ORDER — BUTAMBEN-TETRACAINE-BENZOCAINE 2-2-14 % EX AERO
INHALATION_SPRAY | CUTANEOUS | Status: DC | PRN
  Administered 2014-03-10: 1 via TOPICAL

## 2014-03-10 MED ORDER — MEPERIDINE HCL 50 MG/ML IJ SOLN
INTRAMUSCULAR | Status: DC | PRN
  Administered 2014-03-10 (×2): 25 mg via INTRAVENOUS

## 2014-03-10 NOTE — H&P (Signed)
Ermalinda MemosMichael E Rehm is an 59 y.o. male.   Chief Complaint: Patient is here for EGD. HPI: Patient is 59 year old Caucasian male who has history of gastric ulcer and is here for follow-up EGD. He presented back in July 2015 with epigastric pain and early satiety. He was found have a gastric ulcer in gastric remnant and wide-open anastomosis. H. pylori serology was negative. He is presently free of GI symptoms. He denies nausea vomiting abdominal pain or melena. He is undergoing EGD to document complete healing of this ulcer.  Past Medical History  Diagnosis Date  . Obesity   . Arthritis   . Edema   . DM (diabetes mellitus)     2001    Past Surgical History  Procedure Laterality Date  . Bariatric surgery      2002  . Colonoscopy N/A 09/30/2013    Procedure: COLONOSCOPY;  Surgeon: Malissa HippoNajeeb U Rehman, MD;  Location: [facility];  Service: Endoscopy;  Laterality: N/A;  100-moved to 115 Ann to notify pt  . Esophagogastroduodenoscopy N/A 09/30/2013    Procedure: ESOPHAGOGASTRODUODENOSCOPY (EGD);  Surgeon: Malissa HippoNajeeb U Rehman, MD;  Location: [facility];  Service: Endoscopy;  Laterality: N/A;  . Hernia repair  01/13/2014    Family History  Problem Relation Age of Onset  . Inflammatory bowel disease Maternal Uncle    Social History:  reports that he has never smoked. He does not have any smokeless tobacco history on file. He reports that he does not drink alcohol or use illicit drugs.  Allergies:  Allergies  Allergen Reactions  . Vancomycin Diarrhea    IV Vancomycin  . Tomato Hives, Itching and Rash    Raw Tomato    Medications Prior to Admission  Medication Sig Dispense Refill  . acetaminophen (TYLENOL) 500 MG tablet Take 1,000 mg by mouth every 6 (six) hours as needed for mild pain.    . Cholecalciferol (VITAMIN D3) 2000 UNITS TABS Take 2,000 Units by mouth 2 (two) times daily.    . fexofenadine (ALLEGRA) 180 MG tablet Take 180 mg by mouth daily.    . Hydrocodone-Acetaminophen 5-300  MG TABS Take 1 tablet by mouth 2 (two) times daily as needed. (Patient taking differently: Take 1 tablet by mouth 2 (two) times daily as needed (pain). ) 40 each 0  . niacin 250 MG tablet Take 250 mg by mouth 2 (two) times daily with a meal.    . pantoprazole (PROTONIX) 40 MG tablet TAKE ONE TABLET BY MOUTH TWICE A DAY BEFORE A MEAL 60 tablet 5  . Pediatric Multivit-Minerals-C (CHILDRENS MULTIVITAMIN PO) Take 2 tablets by mouth daily.    . potassium chloride (K-DUR) 10 MEQ tablet Take 20 mEq by mouth 2 (two) times daily.     Marland Kitchen. torsemide (DEMADEX) 20 MG tablet Take 20 mg by mouth as needed (swelling/edema).     . vitamin A 8000 UNIT capsule Take 16,000 Units by mouth 2 (two) times daily.    . vitamin B-12 (CYANOCOBALAMIN) 250 MCG tablet Take 250 mcg by mouth 2 (two) times daily.    . vitamin E 400 UNIT capsule Take 400 Units by mouth 2 (two) times daily.      No results found for this or any previous visit (from the past 48 hour(s)). No results found.  ROS  Blood pressure 176/92, pulse 60, temperature 98.1 F (36.7 C), temperature source Oral, resp. rate 11, height 5\' 9"  (1.753 m), weight 293 lb (132.904 kg), SpO2 100 %. Physical Exam  Constitutional:  He appears well-developed and well-nourished.  HENT:  Mouth/Throat: Oropharynx is clear and moist.  Eyes: Conjunctivae are normal. No scleral icterus.  Neck: No thyromegaly present.  Cardiovascular: Normal rate, regular rhythm and normal heart sounds.   No murmur heard. Respiratory: Effort normal and breath sounds normal.  GI:  Abdomen is soft and nontender. This fullness and right upper quadrant actually and irregularity to abdominal lower abdominal wall felt to be secondary to mesh.  Musculoskeletal: He exhibits edema.  Pitting and nonpitting edema noted to both legs.  Lymphadenopathy:    He has no cervical adenopathy.  Neurological: He is alert.  Skin: Skin is warm and dry.     Assessment/Plan History of gastric ulcer. EGD to  document healing of ulcer.  REHMAN,NAJEEB U 03/10/2014, 12:49 PM

## 2014-03-10 NOTE — Discharge Instructions (Signed)
Decrease pantoprazole to 40 mg by mouth 30 minutes before breakfast daily. Resume other medications and diet as before. No driving for 24 hours. Notify if you have recurrent epigastric pain or tarry stool.   Esophagogastroduodenoscopy Care After Refer to this sheet in the next few weeks. These instructions provide you with information on caring for yourself after your procedure. Your caregiver may also give you more specific instructions. Your treatment has been planned according to current medical practices, but problems sometimes occur. Call your caregiver if you have any problems or questions after your procedure.  HOME CARE INSTRUCTIONS  Do not eat or drink anything until the numbing medicine (local anesthetic) has worn off and your gag reflex has returned. You will know that the local anesthetic has worn off when you can swallow comfortably.  Do not drive for 12 hours after the procedure or as directed by your caregiver.  Only take medicines as directed by your caregiver. SEEK MEDICAL CARE IF:   You cannot stop coughing.  You are not urinating at all or less than usual. SEEK IMMEDIATE MEDICAL CARE IF:  You have difficulty swallowing.  You cannot eat or drink.  You have worsening throat or chest pain.  You have dizziness, lightheadedness, or you faint.  You have nausea or vomiting.  You have chills.  You have a fever.  You have severe abdominal pain.  You have black, tarry, or bloody stools. Document Released: 02/06/2012 Document Reviewed: 02/06/2012 [facility] Patient Information 2015 [place], [place]. This information is not intended to replace advice given to you by your health care provider. Make sure you discuss any questions you have with your health care provider.

## 2014-03-10 NOTE — Op Note (Signed)
EGD PROCEDURE REPORT  PATIENT:  Ermalinda MemosMichael E Nave  MR#:  [id number] Birthdate:  [birth year], 59 y.o., male Endoscopist:  Dr. Malissa HippoNajeeb U. Nathanyel Defenbaugh, MD Referred By:  Dr. Fredirick MaudlinEdward L Hawkins, MD Procedure Date: 03/10/2014  Procedure:   EGD  Indications:  Patient is 59 year old Caucasian male with multiple medical problems was found to have gastric ulcer in July 2015. H. pylori serology was negative. Epigastric pain and early satiety has resolved. He is undergoing EGD to document complete healing of this ulcer.            Informed Consent:  The risks, benefits, alternatives & imponderables which include, but are not limited to, bleeding, infection, perforation, drug reaction and potential missed lesion have been reviewed.  The potential for biopsy, lesion removal, esophageal dilation, etc. have also been discussed.  Questions have been answered.  All parties agreeable.  Please see history & physical in medical record for more information.  Medications:  Demerol 50 mg IV Versed 4 mg IV Cetacaine spray topically for oropharyngeal anesthesia  Description of procedure:  The endoscope was introduced through the mouth and advanced to the second portion of the duodenum without difficulty or limitations. The mucosal surfaces were surveyed very carefully during advancement of the scope and upon withdrawal.  Findings:  Esophagus:  Mucosa of the esophagus was normal. GE junction was wavy but no erosions or ulcers noted. GEJ:  39 cm Stomach:  Stomach was empty with hourglass shape. Proximal compartment with small. Scope easily passed from this segment into the distal segment of stomach. Mucosa in the proximal segment revealed patchy erythema and scar. Mucosa of distal gastric body antrum and pyloric channel was normal. Angularis was unremarkable. Duodenum:  Normal bulbar mucosa. Suture material noted in the second part of duodenum the mucosa and folds were normal.  Therapeutic/Diagnostic Maneuvers Performed:   None  Complications:  None  Impression: Focal gastritis noted involving proximal segment of stomach but gastric ulcer has completely healed. Altered UGI tract secondary to remote gastric bypass surgery.  Recommendations:  Decrease pantoprazole to 40 mg by mouth every morning. Refrain from using NSAIDs.  Izzabella Besse U  03/10/2014  1:14 PM  CC: Dr. Fredirick MaudlinHAWKINS,EDWARD L, MD & Dr. Bonnetta BarryNo ref. provider found

## 2014-03-11 ENCOUNTER — Encounter (HOSPITAL_COMMUNITY): Payer: Self-pay | Admitting: Internal Medicine

## 2014-10-29 ENCOUNTER — Other Ambulatory Visit (INDEPENDENT_AMBULATORY_CARE_PROVIDER_SITE_OTHER): Payer: Self-pay | Admitting: Internal Medicine

## 2017-06-12 ENCOUNTER — Encounter: Payer: Self-pay | Admitting: *Deleted

## 2017-06-13 ENCOUNTER — Encounter: Payer: Self-pay | Admitting: Cardiology

## 2017-06-13 ENCOUNTER — Telehealth: Payer: Self-pay | Admitting: Cardiology

## 2017-06-13 ENCOUNTER — Encounter: Payer: Self-pay | Admitting: *Deleted

## 2017-06-13 ENCOUNTER — Ambulatory Visit (INDEPENDENT_AMBULATORY_CARE_PROVIDER_SITE_OTHER): Payer: Commercial Managed Care - PPO | Admitting: Cardiology

## 2017-06-13 VITALS — BP 160/83 | HR 63 | Ht 68.5 in | Wt 322.0 lb

## 2017-06-13 DIAGNOSIS — Z136 Encounter for screening for cardiovascular disorders: Secondary | ICD-10-CM

## 2017-06-13 DIAGNOSIS — R079 Chest pain, unspecified: Secondary | ICD-10-CM | POA: Diagnosis not present

## 2017-06-13 MED ORDER — ASPIRIN EC 81 MG PO TBEC: 81.0000 mg | DELAYED_RELEASE_TABLET | Freq: Every day | ORAL | Status: DC

## 2017-06-13 NOTE — Patient Instructions (Signed)
Medication Instructions:   Begin Aspirin 81mg  daily.  Continue all other medications.    Labwork: none  Testing/Procedures:  Your physician has requested that you have a lexiscan myoview. For further information please visit [email or web address]. Please follow instruction sheet, as given.  Office will contact with results via phone or letter.    Follow-Up: To be determined.   Any Other Special Instructions Will Be Listed Below (If Applicable).  If you need a refill on your cardiac medications before your next appointment, please call your pharmacy.

## 2017-06-13 NOTE — Progress Notes (Signed)
Clinical Summary Mr. Janee Mornhompson is a 62 y.o.male seen as new consult, referred by Dr Juanetta GoslingHawkins for chest pain.   1. Chest pain - has had some occasoinal chest pain. Neck into left jaw, toothaceh like feeling. Can have some SOB. Example walking trash cans to street can have symptoms. Stop and rests and resolves. Symptoms over 6 months. Notes some episodes with lower levels of exertion.  - sedentary lifestyle  - CAD risk factors: brother CABG at 5759, father with CHF unknown age he thinks late 2130s, mother died heart condition in her 3560s, mother with CVA. Prior DM2 resolved after gastric bypass.    2. Family history of CAD - brother with recent CABG  Past Medical History:  Diagnosis Date  . Arthritis   . DM (diabetes mellitus)    2001  . Edema   . Obesity      Allergies  Allergen Reactions  . Vancomycin Diarrhea    IV Vancomycin  . Tomato Hives, Itching and Rash    Raw Tomato     Current Outpatient Medications  Medication Sig Dispense Refill  . acetaminophen (TYLENOL) 500 MG tablet Take 1,000 mg by mouth every 6 (six) hours as needed for mild pain.    . Cholecalciferol (VITAMIN D3) 2000 UNITS TABS Take 2,000 Units by mouth 2 (two) times daily.    . fexofenadine (ALLEGRA) 180 MG tablet Take 180 mg by mouth daily.    . Hydrocodone-Acetaminophen 5-300 MG TABS Take 1 tablet by mouth 2 (two) times daily as needed. (Patient taking differently: Take 1 tablet by mouth 2 (two) times daily as needed (pain). ) 40 each 0  . niacin 250 MG tablet Take 250 mg by mouth 2 (two) times daily with a meal.    . pantoprazole (PROTONIX) 40 MG tablet Take 1 tablet (40 mg total) by mouth daily. 30 tablet 5  . pantoprazole (PROTONIX) 40 MG tablet TAKE ONE TABLET BY MOUTH TWICE A DAY BEFORE A MEAL 90 tablet 3  . Pediatric Multivit-Minerals-C (CHILDRENS MULTIVITAMIN PO) Take 2 tablets by mouth daily.    . potassium chloride (K-DUR) 10 MEQ tablet Take 20 mEq by mouth 2 (two) times daily.     Marland Kitchen. torsemide  (DEMADEX) 20 MG tablet Take 20 mg by mouth as needed (swelling/edema).     . vitamin A 8000 UNIT capsule Take 16,000 Units by mouth 2 (two) times daily.    . vitamin B-12 (CYANOCOBALAMIN) 250 MCG tablet Take 250 mcg by mouth 2 (two) times daily.    . vitamin E 400 UNIT capsule Take 400 Units by mouth 2 (two) times daily.     No current facility-administered medications for this visit.      Past Surgical History:  Procedure Laterality Date  . BARIATRIC SURGERY     2002  . COLONOSCOPY N/A 09/30/2013   Procedure: COLONOSCOPY;  Surgeon: Malissa HippoNajeeb U Rehman, MD;  [facility];  Service: Endoscopy;  Laterality: N/A;  100-moved to 115 Ann to notify pt  . ESOPHAGOGASTRODUODENOSCOPY N/A 09/30/2013   Procedure: ESOPHAGOGASTRODUODENOSCOPY (EGD);  Surgeon: Malissa HippoNajeeb U Rehman, MD;  [facility];  Service: Endoscopy;  Laterality: N/A;  . ESOPHAGOGASTRODUODENOSCOPY N/A 03/10/2014   Procedure: ESOPHAGOGASTRODUODENOSCOPY (EGD);  Surgeon: Malissa HippoNajeeb U Rehman, MD;  [facility];  Service: Endoscopy;  Laterality: N/A;  100  . HERNIA REPAIR  01/13/2014     Allergies  Allergen Reactions  . Vancomycin Diarrhea    IV Vancomycin  . Tomato Hives,  Itching and Rash    Raw Tomato      Family History  Problem Relation Age of Onset  . Inflammatory bowel disease Maternal Uncle      Social History Mr. Pellerito reports that he has never smoked. He does not have any smokeless tobacco history on file. Mr. Heintzelman reports that he does not drink alcohol.   Review of Systems CONSTITUTIONAL: No weight loss, fever, chills, weakness or fatigue.  HEENT: Eyes: No visual loss, blurred vision, double vision or yellow sclerae.No hearing loss, sneezing, congestion, runny nose or sore throat.  SKIN: No rash or itching.  CARDIOVASCULAR: per hpi RESPIRATORY: per hpi GASTROINTESTINAL: No anorexia, nausea, vomiting or diarrhea. No abdominal pain or blood.  GENITOURINARY: No burning on urination, no  polyuria NEUROLOGICAL: No headache, dizziness, syncope, paralysis, ataxia, numbness or tingling in the extremities. No change in bowel or bladder control.  MUSCULOSKELETAL: No muscle, back pain, joint pain or stiffness.  LYMPHATICS: No enlarged nodes. No history of splenectomy.  PSYCHIATRIC: No history of depression or anxiety.  ENDOCRINOLOGIC: No reports of sweating, cold or heat intolerance. No polyuria or polydipsia.  Marland Kitchen   Physical Examination Vitals:   06/13/17 1443  BP: (!) 160/83  Pulse: 63  SpO2: 94%   Vitals:   06/13/17 1443  Weight: (!) 322 lb (146.1 kg)  Height: 5' 8.5" (1.74 m)    Gen: resting comfortably, no acute distress HEENT: no scleral icterus, pupils equal round and reactive, no palptable cervical adenopathy,  CV: RRR, no m/r/g, no jvd Resp: Clear to auscultation bilaterally GI: abdomen is soft, non-tender, non-distended, normal bowel sounds, no hepatosplenomegaly MSK: extremities are warm, no edema.  Skin: warm, no rash Neuro:  no focal deficits Psych: appropriate affect     Assessment and Plan  1. Chest pain - recent symptoms along with multiple CAD risk factors. - he is unable to run on treadmill - we will plan for a lexiscan to evaluate for possible ischemia.      Antoine Poche, M.D.

## 2017-06-13 NOTE — Telephone Encounter (Signed)
Pre-cert Verification for the following procedure   lexiscan - chest pain - 2 day protocol  [facility]

## 2017-06-18 ENCOUNTER — Encounter: Payer: Self-pay | Admitting: Cardiology

## 2017-06-21 ENCOUNTER — Encounter: Payer: Self-pay | Admitting: *Deleted

## 2017-06-24 ENCOUNTER — Encounter (HOSPITAL_COMMUNITY)
Admission: RE | Admit: 2017-06-24 | Discharge: 2017-06-24 | Disposition: A | Discharge status: Home / Self Care | Payer: Commercial Managed Care - PPO | Source: Ambulatory Visit | Attending: Cardiology | Admitting: Cardiology

## 2017-06-24 DIAGNOSIS — R079 Chest pain, unspecified: Secondary | ICD-10-CM | POA: Diagnosis not present

## 2017-06-24 MED ORDER — TECHNETIUM TC 99M TETROFOSMIN IV KIT
30.0000 | PACK | Freq: Once | INTRAVENOUS | Status: AC | PRN
  Administered 2017-06-24: 28 via INTRAVENOUS

## 2017-06-24 MED ORDER — REGADENOSON 0.4 MG/5ML IV SOLN
INTRAVENOUS | Status: AC
  Administered 2017-06-24: 0.4 mg via INTRAVENOUS
  Filled 2017-06-24: qty 5

## 2017-06-24 MED ORDER — SODIUM CHLORIDE 0.9% FLUSH
INTRAVENOUS | Status: AC
  Administered 2017-06-24: 10 mL via INTRAVENOUS
  Filled 2017-06-24: qty 10

## 2017-06-25 ENCOUNTER — Encounter (HOSPITAL_COMMUNITY)
Admission: RE | Admit: 2017-06-25 | Discharge: 2017-06-25 | Disposition: A | Discharge status: Home / Self Care | Payer: Commercial Managed Care - PPO | Source: Ambulatory Visit | Attending: Cardiology | Admitting: Cardiology

## 2017-06-25 LAB — NM MYOCAR MULTI W/SPECT W/WALL MOTION / EF
CHL CUP NUCLEAR SDS: 2
CHL CUP NUCLEAR SRS: 2
CHL CUP NUCLEAR SSS: 4
LV dias vol: 83 mL (ref 62–150)
LV sys vol: 30 mL
Peak HR: 92 {beats}/min
RATE: 0.39
Rest HR: 62 {beats}/min
TID: 0.97

## 2017-06-25 MED ORDER — TECHNETIUM TC 99M TETROFOSMIN IV KIT
30.0000 | PACK | Freq: Once | INTRAVENOUS | Status: AC | PRN
  Administered 2017-06-25: 26 via INTRAVENOUS

## 2017-06-26 ENCOUNTER — Telehealth: Payer: Self-pay | Admitting: *Deleted

## 2017-06-26 NOTE — Telephone Encounter (Signed)
-----   Message from Antoine PocheJonathan F Branch, MD sent at 06/26/2017  2:21 PM EDT ----- Stress test looks good, no evidence of any blockages. F/u 6 months   Dominga FerryJ Branch MD

## 2017-06-26 NOTE — Telephone Encounter (Signed)
Pt aware and voiced understanding - 6 months recall placed - routed to pcp  

## 2018-06-28 IMAGING — NM NM MYOCAR MULTI W/SPECT W/WALL MOTION & EF
2 series · 12 of 12 positions shown · non-contrast
Comparison: none

[Series 2: stress gated - perfusion · 6.51mm/px · 6 of 64 frames shown]
[frame 6/64]
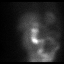
[frame 16/64]
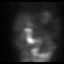
[frame 27/64]
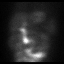
[frame 38/64]
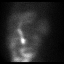
[frame 48/64]
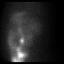
[frame 59/64]
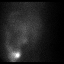

[Series 3: rest · 6.51mm/px · 6 of 64 frames shown]
[frame 6/64]
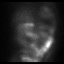
[frame 16/64]
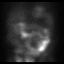
[frame 27/64]
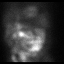
[frame 38/64]
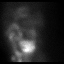
[frame 48/64]
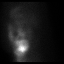
[frame 59/64]
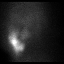

[12 of 12 positions shown; findings below may reference images not displayed]

Canned report from images found in remote index.

Refer to host system for actual result text.

## 2020-01-14 ENCOUNTER — Ambulatory Visit: Payer: Self-pay | Attending: Internal Medicine

## 2020-01-14 DIAGNOSIS — Z23 Encounter for immunization: Secondary | ICD-10-CM

## 2020-01-14 NOTE — Progress Notes (Signed)
   Covid-19 Vaccination Clinic  Name:  [name]    MRN: [id number] DOB: 02-Oct-1955  01/14/2020  [name] was observed post Covid-19 immunization for 15 minutes without incident. He was provided with Vaccine Information Sheet and instruction to access the V-Safe system.   [name] was instructed to call 911 with any severe reactions post vaccine: Marland Kitchen Difficulty breathing  . Swelling of face and throat  . A fast heartbeat  . A bad rash all over body  . Dizziness and weakness

## 2021-02-22 ENCOUNTER — Encounter (INDEPENDENT_AMBULATORY_CARE_PROVIDER_SITE_OTHER): Payer: Self-pay | Admitting: *Deleted

## 2021-03-23 ENCOUNTER — Ambulatory Visit (INDEPENDENT_AMBULATORY_CARE_PROVIDER_SITE_OTHER): Payer: Medicare HMO | Admitting: Gastroenterology

## 2021-03-23 ENCOUNTER — Encounter (INDEPENDENT_AMBULATORY_CARE_PROVIDER_SITE_OTHER): Payer: Self-pay | Admitting: Gastroenterology

## 2021-03-23 ENCOUNTER — Other Ambulatory Visit: Payer: Self-pay

## 2021-03-23 VITALS — BP 153/90 | HR 74 | Temp 99.3°F | Ht 68.5 in | Wt 301.4 lb

## 2021-03-23 DIAGNOSIS — R7989 Other specified abnormal findings of blood chemistry: Secondary | ICD-10-CM | POA: Diagnosis not present

## 2021-03-23 NOTE — Progress Notes (Signed)
Referring Provider: Donetta Potts, MD Primary Care Physician:  Donetta Potts, MD Primary GI Physician: newly established  Chief Complaint  Patient presents with   fatty liver    Pt being seen today for fatty liver. Occasional discomfortant on right side and some swelling. Has 2 -4 BM's a day.    HPI:   [name] is a 66 y.o. male with past medical history of arthritis, DM, previous gastric bypass surgery, and multiple hernia repairs.  Patient presenting today as new patient, referred by Dr. Roger Shelter for elevated LFTs.  Labs done 01/04/21 with Alk phos 141, AST 46, ALT 38, plt count 147, albumin 3.7, T bili 0.6. acute hepatitis panel negative (per notes sent by pcp, no record of this). States that he has had work up in the past, maybe 20 years ago that did not reveal any acute findings other than possible fatty liver. He has had no episodes of confusion, jaundice, pruritus, swelling, rectal bleeding, melena, abdominal pain, constipation, diarrhea, nausea or vomiting. No hx of IV drug use or current illicit drug use. Does not drink alcohol. States last labs prior to November were a while ago, he does not recall if liver enzymes were elevated at that time but does not think they were.  NSAID use:no NSAID use Social hx: no etoh or tobacco Fam hx:no crc or liver disease  Last Colonoscopy:09/30/13 normal except small external hemorrhoids Last Endoscopy:03/10/14 focal gastritis noted involving proximal segment of stomach but gastric ulcer has complete healed, altered UGI tract secondary to remote gastric bypass surgery **09/30/13 gastric ulcer involving proximal stomach, most likely secondary to NSAID use, altered UGI tract secondary to prior surgery.  Recommendations:  Repeat colonoscopy in 10 years  Past Medical History:  Diagnosis Date   Arthritis    DM (diabetes mellitus) (HCC)    2001   Edema    Obesity     Past Surgical History:  Procedure Laterality Date    BARIATRIC SURGERY     2002   COLONOSCOPY N/A 09/30/2013   Procedure: COLONOSCOPY;  Surgeon: Malissa Hippo, MD;  [facility];  Service: Endoscopy;  Laterality: N/A;  100-moved to 115 Ann to notify pt   ESOPHAGOGASTRODUODENOSCOPY N/A 09/30/2013   Procedure: ESOPHAGOGASTRODUODENOSCOPY (EGD);  Surgeon: Malissa Hippo, MD;  [facility];  Service: Endoscopy;  Laterality: N/A;   ESOPHAGOGASTRODUODENOSCOPY N/A 03/10/2014   Procedure: ESOPHAGOGASTRODUODENOSCOPY (EGD);  Surgeon: Malissa Hippo, MD;  [facility];  Service: Endoscopy;  Laterality: N/A;  100   HERNIA REPAIR  01/13/2014    Current Outpatient Medications  Medication Sig Dispense Refill   acetaminophen (TYLENOL) 500 MG tablet Take 1,000 mg by mouth every 6 (six) hours as needed for mild pain.     aspirin EC 81 MG tablet Take 1 tablet (81 mg total) by mouth daily.     Cholecalciferol (VITAMIN D3) 2000 UNITS TABS Take 2,000 Units by mouth 2 (two) times daily.     Fexofenadine-Pseudoephedrine (ALLEGRA-D 24 HOUR PO) Take by mouth.     fluticasone (FLONASE) 50 MCG/ACT nasal spray Place 2 sprays into both nostrils daily.     Hydrocodone-Acetaminophen 5-300 MG TABS Take 1 tablet by mouth 2 (two) times daily as needed. (Patient taking differently: Take 1 tablet by mouth 2 (two) times daily as needed (pain).) 40 each 0   levothyroxine (SYNTHROID) 50 MCG tablet Take 50 mcg by mouth daily before breakfast.  12   Multiple Vitamin (MULTI-VITAMINS) TABS  Take by mouth. 2 daily - gummies     niacin 250 MG tablet Take 250 mg by mouth 2 (two) times daily with a meal.     potassium chloride (K-DUR) 10 MEQ tablet Take 20 mEq by mouth. qid     sertraline (ZOLOFT) 100 MG tablet Take 100 mg by mouth 2 (two) times daily.  12   torsemide (DEMADEX) 20 MG tablet Take 20 mg by mouth as needed (swelling/edema).      vitamin A 8000 UNIT capsule Take 16,000 Units by mouth 2 (two) times daily.     vitamin E 400 UNIT capsule Take 400  Units by mouth 2 (two) times daily.     No current facility-administered medications for this visit.    Allergies as of 03/23/2021 - Review Complete 03/23/2021  Allergen Reaction Noted   Vancomycin Diarrhea 03/02/2014   Tomato Hives, Itching, and Rash 03/02/2014    Family History  Problem Relation Age of Onset   Inflammatory bowel disease Maternal Uncle     Social History   Socioeconomic History   Marital status: Married    Spouse name: Not on file   Number of children: Not on file   Years of education: Not on file   Highest education level: Not on file  Occupational History   Not on file  Tobacco Use   Smoking status: Never   Smokeless tobacco: Never  Vaping Use   Vaping Use: Never used  Substance and Sexual Activity   Alcohol use: No   Drug use: No   Sexual activity: Not on file  Other Topics Concern   Not on file  Social History Narrative   Not on file   Social Determinants of Health   Financial Resource Strain: Not on file  Food Insecurity: Not on file  Transportation Needs: Not on file  Physical Activity: Not on file  Stress: Not on file  Social Connections: Not on file   Review of systems General: negative for malaise, night sweats, fever, chills, weight loss Neck: Negative for lumps, goiter, pain and significant neck swelling Resp: Negative for cough, wheezing, dyspnea at rest CV: Negative for chest pain, leg swelling, palpitations, orthopnea GI: denies melena, hematochezia, nausea, vomiting, diarrhea, constipation, dysphagia, odyonophagia, early satiety or unintentional weight loss.  MSK: Negative for joint pain or swelling, back pain, and muscle pain. Derm: Negative for itching or rash Psych: Denies depression, anxiety, memory loss, confusion. No homicidal or suicidal ideation.  Heme: Negative for prolonged bleeding, bruising easily, and swollen nodes. Endocrine: Negative for cold or heat intolerance, polyuria, polydipsia and goiter. Neuro:  negative for tremor, gait imbalance, syncope and seizures. The remainder of the review of systems is noncontributory.  Physical Exam: BP (!) 153/90 (BP Location: Right Arm, Patient Position: Sitting, Cuff Size: Normal)    Pulse 74    Temp 99.3 F (37.4 C) (Oral)    Ht 5' 8.5" (1.74 m)    Wt (!) 301 lb 6.4 oz (136.7 kg)    BMI 45.16 kg/m  General:   Alert and oriented. No distress noted. Pleasant and cooperative.  Head:  Normocephalic and atraumatic. Eyes:  Conjuctiva clear without scleral icterus. Mouth:  Oral mucosa pink and moist. Good dentition. No lesions. Heart: Normal rate and rhythm, s1 and s2 heart sounds present.  Lungs: Clear lung sounds in all lobes. Respirations equal and unlabored. Abdomen:  +BS, soft, non-tender and non-distended. No rebound or guarding. No HSM or masses noted. Derm: No palmar erythema or jaundice  Msk:  Symmetrical without gross deformities. Normal posture. Extremities:  Without edema. Neurologic:  Alert and  oriented x4 Psych:  Alert and cooperative. Normal mood and affect.  Invalid input(s): 6 MONTHS   ASSESSMENT: [name] is a 66 y.o. male presenting today as a new patient for elevated LFTs.  LFTs mildly elevated in November without known risk factors other than obesity and DM. Patient reports previously elevated LFTs with normal workup many years ago. PCP notes report recent negative acute hepatitis panel, though no lab results available for review. Patient is unsure of last labs prior to the ones in November but does not think LFTs were elevated at that time. I suspect there is some underlying fatty liver, however, we will recheck LFTs today to see if they remain elevated, if so we will proceed with further workup to include serologies for AIH and RUQ [place] for evaluation of possible fatty liver vs NASH/cirrhosis, patient verbalized understanding and is amenable to this plan. Patient educated on mediterranean diet and importance of exercise and  weight reduction.    PLAN:  Check CBC and CMP today 2. If liver enzymes continuously elevated, will proceed with RUQ [place] and further serologies to rule out fatty liver, AIH, cirrhosis 3. Educated on [employer], importance of regular exericse 30 mins a day, 4-5 days/week wit reduction of weight by 5-10%   Follow Up: TBD  Pritesh Sobecki L. Alver Sorrow, MSN, APRN, AGNP-C Adult-Gerontology Nurse Practitioner [facility]

## 2021-03-23 NOTE — Patient Instructions (Signed)
We will recheck labs today, if liver enzymes are still elevated we will proceed with [place] of your liver and further labs to confirm fatty liver and rule out any other causes of continuous elevated liver enzymes.  I am providing information on the mediterranean diet as I suspect there is some aspect of fatty liver, it is important to eat a well balanced diet with plenty of whole grains, fruits, veggies and healthy fats. Getting 30 minutes of exercise 4-5x/week with a 5-10% reduction in weight is beneficial in patients who do have a fatty liver.

## 2021-03-24 LAB — CBC
HCT: 46.1 % (ref 38.5–50.0)
Hemoglobin: 16 g/dL (ref 13.2–17.1)
MCH: 33.6 pg — ABNORMAL HIGH (ref 27.0–33.0)
MCHC: 34.7 g/dL (ref 32.0–36.0)
MCV: 96.8 fL (ref 80.0–100.0)
MPV: 11.3 fL (ref 7.5–12.5)
Platelets: 165 10*3/uL (ref 140–400)
RBC: 4.76 10*6/uL (ref 4.20–5.80)
RDW: 12 % (ref 11.0–15.0)
WBC: 5.4 10*3/uL (ref 3.8–10.8)

## 2021-03-24 LAB — COMPREHENSIVE METABOLIC PANEL
AG Ratio: 1.2 (calc) (ref 1.0–2.5)
ALT: 41 U/L (ref 9–46)
AST: 45 U/L — ABNORMAL HIGH (ref 10–35)
Albumin: 3.6 g/dL (ref 3.6–5.1)
Alkaline phosphatase (APISO): 143 U/L (ref 35–144)
BUN: 11 mg/dL (ref 7–25)
CO2: 22 mmol/L (ref 20–32)
Calcium: 9.1 mg/dL (ref 8.6–10.3)
Chloride: 106 mmol/L (ref 98–110)
Creat: 1.22 mg/dL (ref 0.70–1.35)
Globulin: 3 g/dL (calc) (ref 1.9–3.7)
Glucose, Bld: 94 mg/dL (ref 65–99)
Potassium: 4.2 mmol/L (ref 3.5–5.3)
Sodium: 138 mmol/L (ref 135–146)
Total Bilirubin: 0.8 mg/dL (ref 0.2–1.2)
Total Protein: 6.6 g/dL (ref 6.1–8.1)

## 2021-03-28 ENCOUNTER — Other Ambulatory Visit (INDEPENDENT_AMBULATORY_CARE_PROVIDER_SITE_OTHER): Payer: Self-pay | Admitting: Gastroenterology

## 2021-03-28 DIAGNOSIS — R7989 Other specified abnormal findings of blood chemistry: Secondary | ICD-10-CM

## 2021-04-04 ENCOUNTER — Other Ambulatory Visit: Payer: Self-pay

## 2021-04-04 ENCOUNTER — Ambulatory Visit (HOSPITAL_COMMUNITY)
Admission: RE | Admit: 2021-04-04 | Discharge: 2021-04-04 | Disposition: A | Discharge status: Home / Self Care | Payer: Medicare HMO | Source: Ambulatory Visit | Attending: Gastroenterology | Admitting: Gastroenterology

## 2021-04-04 DIAGNOSIS — R7989 Other specified abnormal findings of blood chemistry: Secondary | ICD-10-CM | POA: Diagnosis present

## 2021-04-04 LAB — IGG, IGA, IGM
IgG (Immunoglobin G), Serum: 1086 mg/dL (ref 600–1540)
IgM, Serum: 186 mg/dL (ref 50–300)
Immunoglobulin A: 271 mg/dL (ref 70–320)

## 2021-04-04 LAB — CERULOPLASMIN: Ceruloplasmin: 21 mg/dL (ref 18–36)

## 2021-04-04 LAB — PROTIME-INR
INR: 1.1
Prothrombin Time: 11.2 s (ref 9.0–11.5)

## 2021-04-04 LAB — ANTI-SMOOTH MUSCLE ANTIBODY, IGG: Actin (Smooth Muscle) Antibody (IGG): <20 U (ref <20)

## 2021-04-04 LAB — ANTI-NUCLEAR AB-TITER (ANA TITER): ANA Titer 1: 1:40 {titer} — ABNORMAL HIGH

## 2021-04-04 LAB — ANA: Anti Nuclear Antibody (ANA): POSITIVE — AB

## 2021-04-04 LAB — MITOCHONDRIAL ANTIBODIES: Mitochondrial M2 Ab, IgG: <=20 U (ref <=20.0)

## 2021-04-04 LAB — ALPHA-1-ANTITRYPSIN: A-1 Antitrypsin, Ser: 151 mg/dL (ref 83–199)

## 2021-04-21 ENCOUNTER — Encounter (INDEPENDENT_AMBULATORY_CARE_PROVIDER_SITE_OTHER): Payer: Self-pay

## 2022-04-08 IMAGING — US US ABDOMEN COMPLETE W/ ELASTOGRAPHY
2 series · 12 of 25 positions shown · non-contrast
Comparison: CT 01/18/2004

CLINICAL DATA: elevated LFTs/concern for fatty liver vs NASH

EXAM:
ULTRASOUND ABDOMEN
ULTRASOUND HEPATIC ELASTOGRAPHY
TECHNIQUE: Sonography of the upper abdomen was performed. In addition,
ultrasound elastography evaluation of the liver was performed. A
region of interest was placed within the right lobe of the liver.
Following application of a compressive sonographic pulse, tissue
compressibility was assessed. Multiple assessments were performed at
the selected site. Median tissue compressibility was determined.
Previously, hepatic stiffness was assessed by shear wave velocity.
Based on recently published Society of Radiologists in Ultrasound
consensus article, reporting is now recommended to be performed in
the SI units of pressure (kiloPascals) representing hepatic
stiffness/elasticity. The obtained result is compared to the
published reference standards. (cACLD = compensated Advanced Chronic
Liver Disease)

[Series 1: us abdomen complete w/elastography · 9 of 65 slices shown (1 of 2)]
[im 4/65]
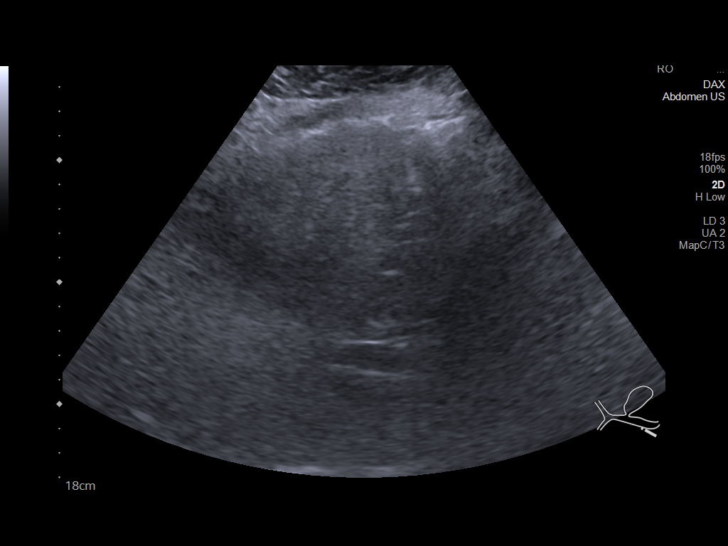
[im 12/65]
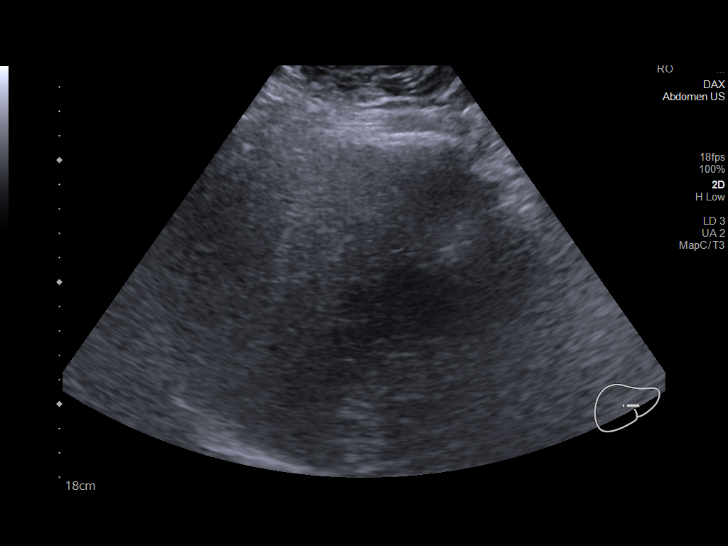
[im 19/65]
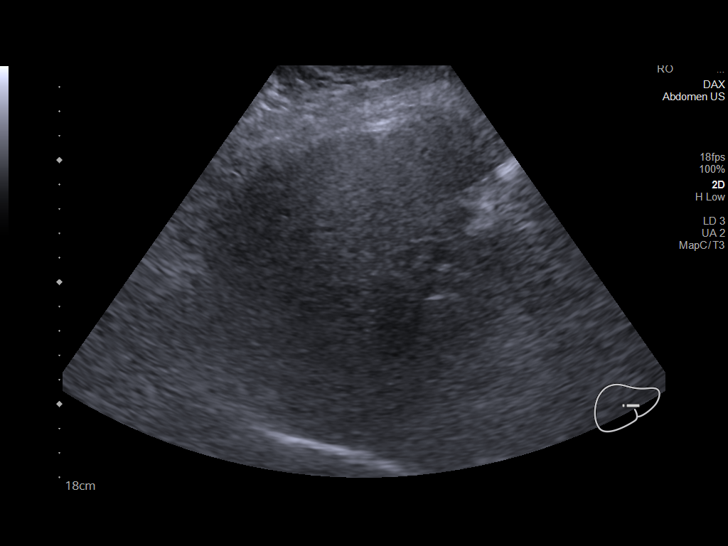
[im 27/65]
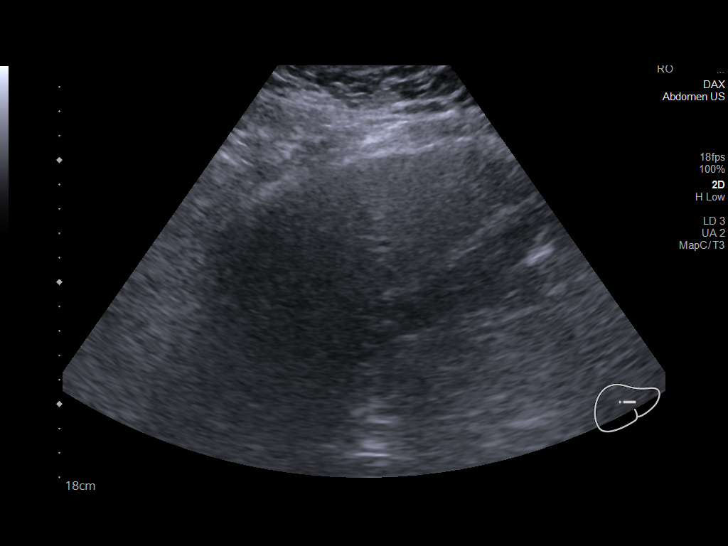
[im 34/65]
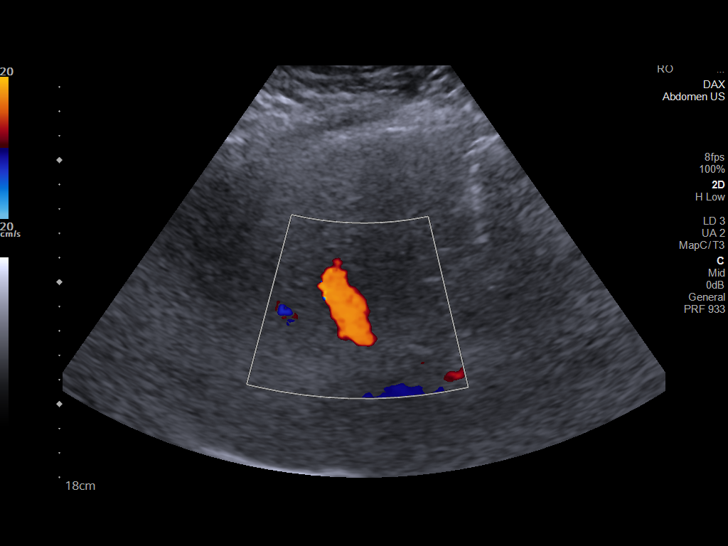
[im 42/65]
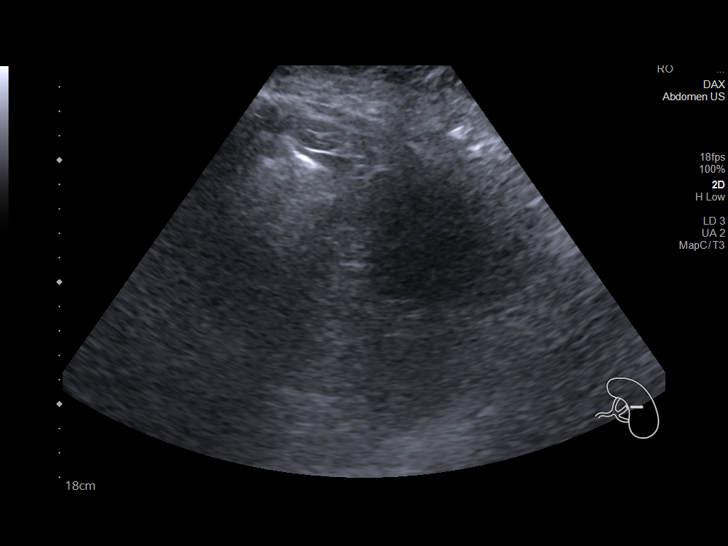
[im 49/65]
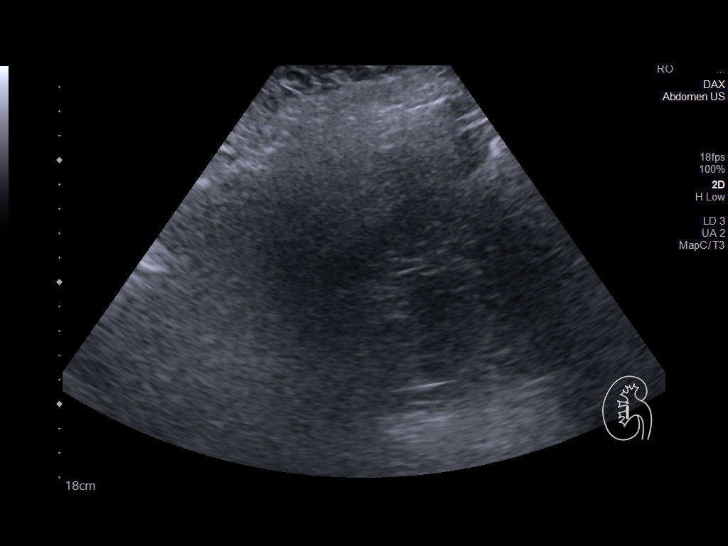
[im 57/65]
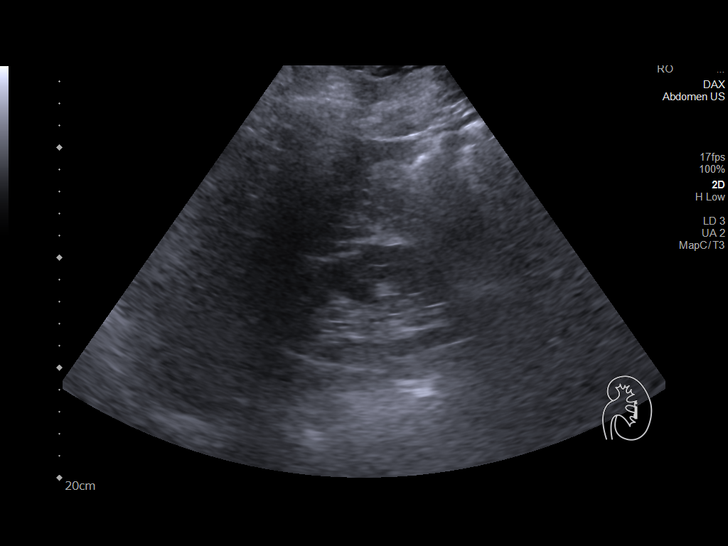
[im 65/65]
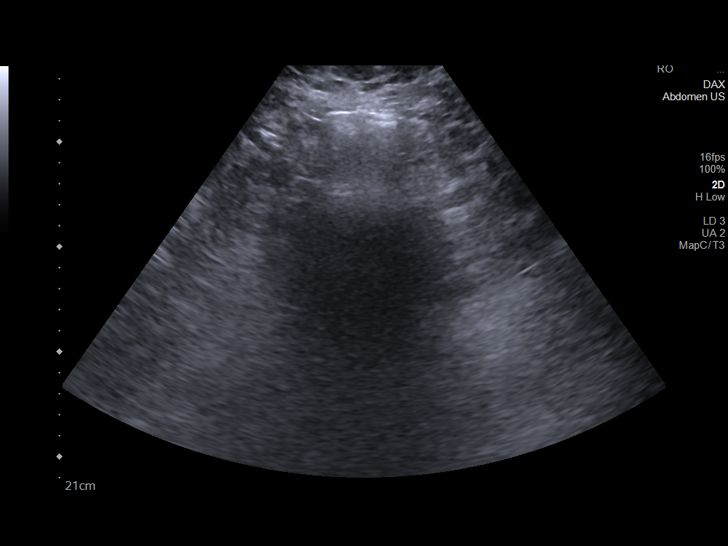

[Series 1001: us abdomen complete w/elastography · 3 of 23 slices shown (2 of 2)]
[im 4/23]
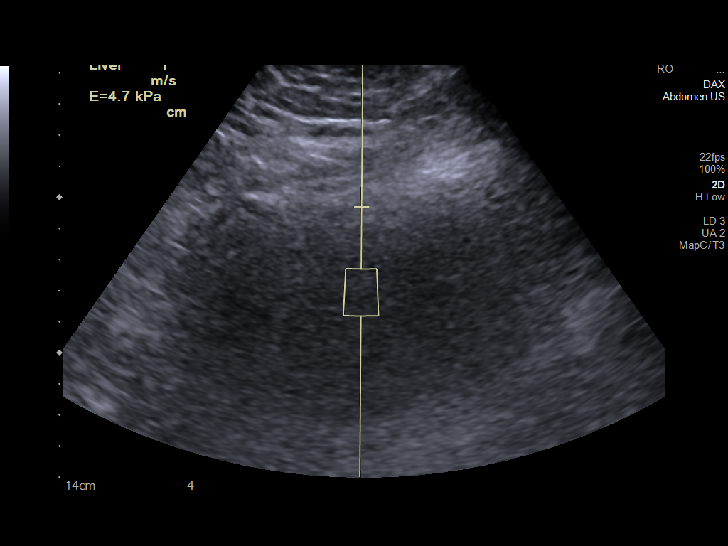
[im 12/23]
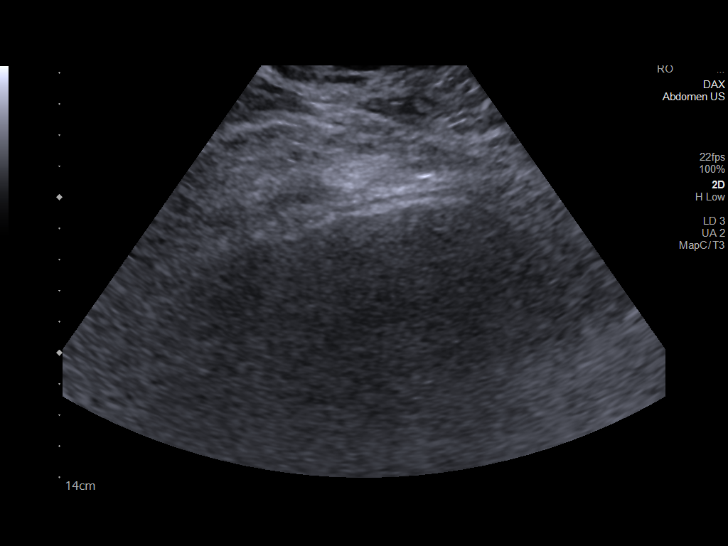
[im 19/23]
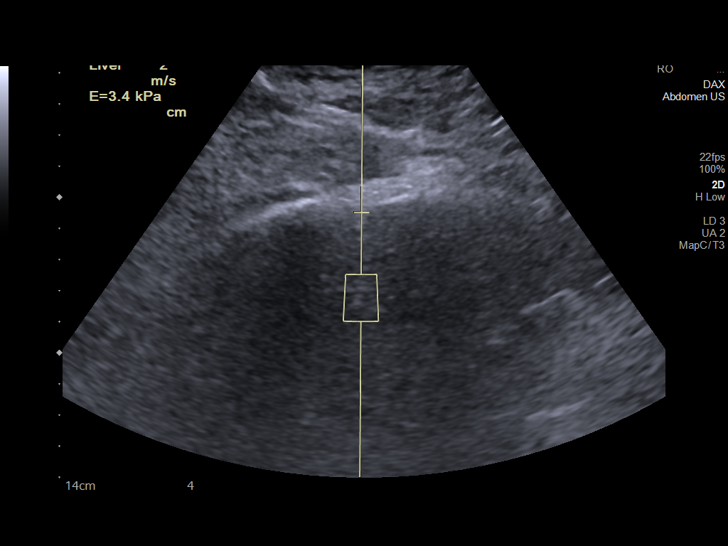

[12 of 25 positions shown; findings below may reference images not displayed]

FINDINGS: ULTRASOUND ABDOMEN

Gallbladder: Prior cholecystectomy

Common bile duct: Diameter: Normal caliber, 6 mm

Liver: Increased echotexture compatible with fatty infiltration. No
focal abnormality or biliary ductal dilatation. Portal vein is
patent on color Doppler imaging with normal direction of blood flow
towards the liver.

IVC: Not visualized due to overlying bowel gas

Pancreas: Not visualized due to overlying bowel gas

Spleen: Size and appearance within normal limits.

Right Kidney: Length: 9.1 cm. Echogenicity within normal limits. No
mass or hydronephrosis visualized.

Left Kidney: Length: 10.2 cm. Echogenicity within normal limits. No
mass or hydronephrosis visualized.

Abdominal aorta: Not visualized due to overlying bowel gas

Other findings: None.

ULTRASOUND HEPATIC ELASTOGRAPHY

Device: Siemens Helix VTQ

Patient position: Oblique

Transducer PRATYUSHA

Number of measurements: 10

Hepatic segment:  8

Median kPa:

IQR:

IQR/Median kPa ratio:

Data quality: IQR/Median kPa ratio of 0.3 or greater indicates
reduced accuracy

Diagnostic category:  < or = 5 kPa: high probability of being normal

The use of hepatic elastography is applicable to patients with viral
hepatitis and non-alcoholic fatty liver disease. At this time, there
is insufficient data for the referenced cut-off values and use in
other causes of liver disease, including alcoholic liver disease.
Patients, however, may be assessed by elastography and serve as
their own reference standard/baseline.

In patients with non-alcoholic liver disease, the values suggesting
compensated advanced chronic liver disease (cACLD) may be lower, and
patients may need additional testing with elasticity results of [DATE]
kPa.

Please note that abnormal hepatic elasticity and shear wave
velocities may also be identified in clinical settings other than
with hepatic fibrosis, such as: acute hepatitis, elevated right
heart and central venous pressures including use of beta blockers,
Yeishon disease ([place]), infiltrative processes such as
mastocytosis/amyloidosis/infiltrative tumor/lymphoma, extrahepatic
cholestasis, with hyperemia in the post-prandial state, and with
liver transplantation. Correlation with patient history, laboratory
data, and clinical condition recommended.

Diagnostic Categories:

< or =5 kPa: high probability of being normal

< or =9 kPa: in the absence of other known clinical signs, rules [DATE] kPa and ?13 kPa: suggestive of cACLD, but needs further testing

>13 kPa: highly suggestive of cACLD

> or =17 kPa: highly suggestive of cACLD with an increased
probability of clinically significant portal hypertension
IMPRESSION: ULTRASOUND ABDOMEN:

Hepatic steatosis

ULTRASOUND HEPATIC ELASTOGRAPHY:

Median kPa:

Diagnostic category:  < or = 5 kPa: high probability of being normal

## 2023-07-18 ENCOUNTER — Other Ambulatory Visit: Payer: Self-pay | Admitting: Medical Genetics

## 2023-09-20 ENCOUNTER — Encounter (INDEPENDENT_AMBULATORY_CARE_PROVIDER_SITE_OTHER): Payer: Self-pay | Admitting: *Deleted

## 2023-10-23 ENCOUNTER — Telehealth: Payer: Self-pay

## 2023-10-23 NOTE — Telephone Encounter (Signed)
 Who is your primary care physician: Vaughn Pouch  Reasons for the colonoscopy: screening  Have you had a colonoscopy before?  Yes 08-20-2013  Do you have family history of colon cancer? no  Previous colonoscopy with polyps removed? no  Do you have a history colorectal cancer?   no  Are you diabetic? If yes, Type 1 or Type 2?    no  Do you have a prosthetic or mechanical heart valve? no  Do you have a pacemaker/defibrillator?   no  Have you had endocarditis/atrial fibrillation? no  Have you had joint replacement within the last 12 months?  no  Do you tend to be constipated or have to use laxatives? no  Do you have any history of drugs or alchohol?  Yes none since 1991  Do you use supplemental oxygen?  no  Have you had a stroke or heart attack within the last 6 months? no  Do you take weight loss medication?  no     Do you take any blood-thinning medications such as: (aspirin , warfarin, Plavix, Aggrenox)  yes  If yes we need the name, milligram, dosage and who is prescribing doctor ASA 81 mg Current Outpatient Medications on File Prior to Visit  Medication Sig Dispense Refill   levothyroxine (SYNTHROID) 50 MCG tablet Take 50 mcg by mouth daily before breakfast.  12   Multiple Vitamin (MULTI-VITAMINS) TABS Take by mouth. 2 daily - gummies     potassium chloride (K-DUR) 10 MEQ tablet Take 20 mEq by mouth. qid     sertraline (ZOLOFT) 100 MG tablet Take 100 mg by mouth 2 (two) times daily.  12   No current facility-administered medications on file prior to visit.    Allergies  Allergen Reactions   Vancomycin Diarrhea    IV Vancomycin   Brompheniramine-Phenylephrine Dermatitis and Hives   Tomato Hives, Itching and Rash    Raw Tomato     Pharmacy: [facility]  Primary Insurance Name: Hulan Many [id number]  Best number where you can be reached: [phone]

## 2023-10-24 NOTE — Telephone Encounter (Signed)
Ok to schedule.  Room : any   Thanks,  Vista Lawman, MD Gastroenterology and Hepatology [facility]

## 2023-10-28 NOTE — Telephone Encounter (Signed)
 Noted. Will call to schedule once we get future schedule

## 2023-11-18 NOTE — Telephone Encounter (Signed)
 LMTRC

## 2023-11-18 NOTE — Telephone Encounter (Signed)
 LMTCB to schedule.

## 2023-11-21 NOTE — Telephone Encounter (Signed)
 Pt is wanting to have EGD done also. He says he had one years ago for gastric ulcer. He wants to have EGD to make sure gastric ulcer has healed or if it's still there.

## 2023-11-21 NOTE — Telephone Encounter (Signed)
 noted

## 2023-11-21 NOTE — Telephone Encounter (Signed)
 LMOVM to return call  Pt left vm returning call

## 2023-11-25 ENCOUNTER — Telehealth (INDEPENDENT_AMBULATORY_CARE_PROVIDER_SITE_OTHER): Payer: Self-pay

## 2023-11-25 ENCOUNTER — Encounter (INDEPENDENT_AMBULATORY_CARE_PROVIDER_SITE_OTHER): Payer: Self-pay | Admitting: Gastroenterology

## 2023-11-25 ENCOUNTER — Ambulatory Visit (INDEPENDENT_AMBULATORY_CARE_PROVIDER_SITE_OTHER): Admitting: Gastroenterology

## 2023-11-25 VITALS — BP 136/76 | HR 67 | Temp 97.2°F | Ht 69.0 in | Wt 247.9 lb

## 2023-11-25 DIAGNOSIS — R198 Other specified symptoms and signs involving the digestive system and abdomen: Secondary | ICD-10-CM | POA: Insufficient documentation

## 2023-11-25 DIAGNOSIS — R634 Abnormal weight loss: Secondary | ICD-10-CM | POA: Diagnosis not present

## 2023-11-25 DIAGNOSIS — R748 Abnormal levels of other serum enzymes: Secondary | ICD-10-CM | POA: Insufficient documentation

## 2023-11-25 DIAGNOSIS — R630 Anorexia: Secondary | ICD-10-CM | POA: Insufficient documentation

## 2023-11-25 NOTE — Progress Notes (Signed)
 [name] , M.D. Gastroenterology & Hepatology [facility] Gastroenterology [address] [place], [place] [address] Primary Care Physician: Trudy Vaughn FALCON, MD [address] [place] [address]  Chief Complaint: Unintentional weight loss. Altered Bowel habits, elevated liver enzymes  History of Present Illness: [name] is a 68 y.o. male with arthritis, DM, previous gastric bypass surgery, and multiple hernia repairs. who presents for evaluation of  Unintentional weight loss. Altered Bowel habits, elevated liver enzymes  Patient was last seen by GI in 2023 for elevated liver enzymes.  Patient reports that since December 2024 he has lost 40 pounds which is unintentional.  Patient reports loss of appetite which is a new symptom.  Recently his bowel movement has been altered where he would have mostly liquid stool with urgency The patient denies having any nausea, vomiting, fever, chills, hematochezia, melena, hematemesis, abdominal distention, abdominal pain, jaundice, pruritus   Last Colonoscopy:09/30/13 normal except small external hemorrhoids Last Endoscopy:03/10/14 focal gastritis noted involving proximal segment of stomach but gastric ulcer has complete healed, altered UGI tract secondary to remote gastric bypass surgery **09/30/13 gastric ulcer involving proximal stomach, most likely secondary to NSAID use, altered UGI tract secondary to prior surgery.   Recommendations:  Repeat colonoscopy in 10 years  Labs for elevated liver enzymes with INR 1.1 Positive ANA (1:40)  negative AMA ASMA   Past Medical History: Past Medical History:  Diagnosis Date   Arthritis    DM (diabetes mellitus) (HCC)    2001   Edema    Obesity     Past Surgical History: Past Surgical History:  Procedure Laterality Date   BARIATRIC SURGERY     2002   COLONOSCOPY N/A 09/30/2013   Procedure: COLONOSCOPY;  Surgeon: Claudis RAYMOND Rivet, MD;  [facility];  Service: Endoscopy;  Laterality: N/A;  100-moved to 115 Ann to notify pt   ESOPHAGOGASTRODUODENOSCOPY N/A 09/30/2013   Procedure: ESOPHAGOGASTRODUODENOSCOPY (EGD);  Surgeon: Claudis RAYMOND Rivet, MD;  Location[facility];  Service: Endoscopy;  Laterality: N/A;   ESOPHAGOGASTRODUODENOSCOPY N/A 03/10/2014   Procedure: ESOPHAGOGASTRODUODENOSCOPY (EGD);  Surgeon: Claudis RAYMOND Rivet, MD;  [facility];  Service: Endoscopy;  Laterality: N/A;  100   HERNIA REPAIR  01/13/2014    Family History: Family History  Problem Relation Age of Onset   Inflammatory bowel disease Maternal Uncle     Social History: Social History   Tobacco Use  Smoking Status Never  Smokeless Tobacco Never   Social History   Substance and Sexual Activity  Alcohol Use No   Social History   Substance and Sexual Activity  Drug Use No    Allergies: Allergies  Allergen Reactions   Vancomycin Diarrhea    IV Vancomycin   Brompheniramine-Phenylephrine Dermatitis and Hives   Tomato Hives, Itching and Rash    Raw Tomato    Medications: Current Outpatient Medications  Medication Sig Dispense Refill   aspirin  EC 81 MG tablet Take 81 mg by mouth daily. Swallow whole.     levothyroxine (SYNTHROID) 50 MCG tablet Take 50 mcg by mouth daily before breakfast.  12   Multiple Vitamin (MULTI-VITAMINS) TABS Take by mouth. 2 daily - gummies     potassium chloride (K-DUR) 10 MEQ tablet Take 20 mEq by mouth. qid (Patient taking differently: Take 40 mEq by mouth QID. qid)     sertraline (ZOLOFT) 100 MG tablet Take 100 mg by mouth 2 (two) times daily.  12  UNABLE TO FIND Med Name: Vitamin A,E,D     No current facility-administered medications for this visit.    Review of Systems: GENERAL: negative for malaise, night sweats HEENT: No changes in hearing or vision, no nose bleeds or other nasal problems. NECK: Negative for lumps, goiter, pain and significant neck swelling RESPIRATORY: Negative for cough,  wheezing CARDIOVASCULAR: Negative for chest pain, leg swelling, palpitations, orthopnea GI: SEE HPI MUSCULOSKELETAL: Negative for joint pain or swelling, back pain, and muscle pain. SKIN: Negative for lesions, rash HEMATOLOGY Negative for prolonged bleeding, bruising easily, and swollen nodes. ENDOCRINE: Negative for cold or heat intolerance, polyuria, polydipsia and goiter. NEURO: negative for tremor, gait imbalance, syncope and seizures. The remainder of the review of systems is noncontributory.   Physical Exam: BP 136/76   Pulse 67   Temp (!) 97.2 F (36.2 C)   Ht 5' 9 (1.753 m)   Wt 247 lb 14.4 oz (112.4 kg)   BMI 36.61 kg/m  GENERAL: The patient is AO x3, in no acute distress. HEENT: Head is normocephalic and atraumatic. EOMI are intact. Mouth is well hydrated and without lesions. NECK: Supple. No masses LUNGS: Clear to auscultation. No presence of rhonchi/wheezing/rales. Adequate chest expansion HEART: RRR, normal s1 and s2. ABDOMEN: Soft, nontender, no guarding, no peritoneal signs, and nondistended. BS +. No masses.   Imaging/Labs: as above     Latest Ref Rng & Units 03/23/2021    3:35 PM  CBC  WBC 3.8 - 10.8 Thousand/uL 5.4   Hemoglobin 13.2 - 17.1 g/dL 83.9   Hematocrit 61.4 - 50.0 % 46.1   Platelets 140 - 400 Thousand/uL 165    No results found for: IRON, TIBC, FERRITIN  I personally reviewed and interpreted the available labs, imaging and endoscopic files.  Hepatic steatosis   ULTRASOUND HEPATIC ELASTOGRAPHY:   Median kPa:  3.5   Diagnostic category:  < or = 5 kPa: high probability of being normal  Impression and Plan: [name] is a 68 y.o. male with arthritis, DM, previous gastric bypass surgery, and multiple hernia repairs. who presents for evaluation of  Unintentional weight loss. Altered Bowel habits, elevated liver enzymes  # Unintentional weight loss #Altered bowel movements # Peptic ulcer disease  Patient reportedly had  40lb unintentional weight loss since December with loss of appetite which is normal  Currently having more frequency but has not seen a suspect related to overflow diarrhea  Will obtain CT abdomen pelvis with IV contrast for workup of unintentional weight loss  Patient has history of peptic ulcer disease ,He is due for his colonoscopy as last performed 2015   Will plan on upper endoscopy given unintentional weight loss and loss of appetite.  Colonoscopy and will perform random colon biopsies to make sure were not dealing with microscopic colitis.  Ensure adequate fluid intake: Aim for 8 glasses of water  daily. Follow a high fiber diet: Include foods such as dates, prunes, pears, and kiwi. Use Metamucil twice a day.  # Elevated liver enzymes  Autoimmune serologies were negative previously.  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      Aurther Harlin Faizan Janeene Sand, MD Gastroenterology and Hepatology [facility]   This chart has been completed using [facility] Dictation software, and while attempts have been made to ensure accuracy , certain words and phrases may not be  transcribed as intended

## 2023-11-25 NOTE — Patient Instructions (Signed)
 It was very nice to meet you today, as dicussed with will plan for the following :  1) Ensure adequate fluid intake: Aim for 8 glasses of water  daily. Follow a high fiber diet: Include foods such as dates, prunes, pears, and kiwi. Use Metamucil twice a day.  2) Upper endoscopy and colonoscopy  3) Ct Abdomen and pelvis

## 2023-11-25 NOTE — Telephone Encounter (Signed)
 Prior authorization with [employer]:  CPT Code: 25822 Description: CT ABDOMEN & PELVIS W/ Authorization Number: [id number] Case Number: [id number] Review Date: 11/25/2023 4:46:33 PM Expiration Date: 05/23/2024 Status: Your case has been Approved. The prior authorization you submitted, Case [id number], has been received. Additional case status notifications will be sent if you opted in for email notifications. Thank you.

## 2023-11-26 LAB — COMPREHENSIVE METABOLIC PANEL WITH GFR
ALT: 27 IU/L (ref 0–44)
AST: 35 IU/L (ref 0–40)
Albumin: 3.9 g/dL (ref 3.9–4.9)
Alkaline Phosphatase: 176 IU/L — ABNORMAL HIGH (ref 47–123)
BUN/Creatinine Ratio: 10 (ref 10–24)
BUN: 12 mg/dL (ref 8–27)
Bilirubin Total: 0.6 mg/dL (ref 0.0–1.2)
CO2: 21 mmol/L (ref 20–29)
Calcium: 8.8 mg/dL (ref 8.6–10.2)
Chloride: 105 mmol/L (ref 96–106)
Creatinine, Ser: 1.17 mg/dL (ref 0.76–1.27)
Globulin, Total: 2.6 g/dL (ref 1.5–4.5)
Glucose: 86 mg/dL (ref 70–99)
Potassium: 4.3 mmol/L (ref 3.5–5.2)
Sodium: 141 mmol/L (ref 134–144)
Total Protein: 6.5 g/dL (ref 6.0–8.5)
eGFR: 67 mL/min/1.73 (ref >59)

## 2023-11-26 LAB — HEPATITIS B SURFACE ANTIBODY,QUALITATIVE: Hep B Surface Ab, Qual: NONREACTIVE

## 2023-11-26 LAB — HEPATITIS B SURFACE ANTIGEN: Hepatitis B Surface Ag: NEGATIVE

## 2023-11-26 LAB — HEPATITIS C ANTIBODY: Hep C Virus Ab: NONREACTIVE

## 2023-11-26 LAB — HEPATITIS B CORE ANTIBODY, TOTAL: Hep B Core Total Ab: NEGATIVE

## 2023-11-26 MED ORDER — PEG 3350-KCL-NA BICARB-NACL 420 G PO SOLR: 4000.0000 mL | Freq: Once | ORAL | 0 refills | Status: AC

## 2023-11-26 NOTE — Telephone Encounter (Signed)
 Spoke with patient, scheduled TCS/EGD for 01/03/2024 at 11:45am. Rx sent to pharmacy, instructions mailed.

## 2023-11-26 NOTE — Telephone Encounter (Signed)
 Spoke with pt, gave him his CT appointment for 01/02/2024 at 8:00am at [facility]

## 2023-11-26 NOTE — Addendum Note (Signed)
 Addended by: DALLIE LIONEL RAMAN on: 11/26/2023 10:05 AM   Modules accepted: Orders

## 2023-11-29 ENCOUNTER — Ambulatory Visit (INDEPENDENT_AMBULATORY_CARE_PROVIDER_SITE_OTHER): Payer: Self-pay | Admitting: Gastroenterology

## 2023-11-29 NOTE — Progress Notes (Signed)
 Hi [name] ,   Can you please call the patient and tell the patient the lab work shows slightly elevated liver enzyme (ALP) but good news you do not have hepatitis B or C.  I look forward to seeing results of your CT scan   Thanks,   Dionne Knoop Faizan Smiley Birr, MD Gastroenterology and Hepatology [facility] Gastroenterology =============   Labs ALP 176 (elevated).    Hepatitis B nonimmune nonexposed Hepatitis C negative   AMA was negative in 2023.  Will repeat labs in future

## 2023-12-04 ENCOUNTER — Other Ambulatory Visit (HOSPITAL_COMMUNITY)

## 2023-12-20 ENCOUNTER — Other Ambulatory Visit: Payer: Self-pay | Admitting: Medical Genetics

## 2023-12-20 DIAGNOSIS — Z006 Encounter for examination for normal comparison and control in clinical research program: Secondary | ICD-10-CM

## 2023-12-30 ENCOUNTER — Encounter (HOSPITAL_COMMUNITY): Payer: Self-pay

## 2023-12-30 ENCOUNTER — Other Ambulatory Visit: Payer: Self-pay

## 2023-12-30 ENCOUNTER — Encounter (HOSPITAL_COMMUNITY)
Admission: RE | Admit: 2023-12-30 | Discharge: 2023-12-30 | Disposition: A | Discharge status: Home / Self Care | Source: Ambulatory Visit | Attending: Gastroenterology | Admitting: Gastroenterology

## 2023-12-30 HISTORY — DX: Anxiety disorder, unspecified: F41.9

## 2023-12-30 HISTORY — DX: Hypothyroidism, unspecified: E03.9

## 2024-01-02 ENCOUNTER — Encounter (HOSPITAL_COMMUNITY): Payer: Self-pay | Admitting: Radiology

## 2024-01-02 ENCOUNTER — Ambulatory Visit (HOSPITAL_COMMUNITY)
Admission: RE | Admit: 2024-01-02 | Discharge: 2024-01-02 | Disposition: A | Discharge status: Home / Self Care | Source: Ambulatory Visit | Attending: Gastroenterology | Admitting: Gastroenterology

## 2024-01-02 DIAGNOSIS — R634 Abnormal weight loss: Secondary | ICD-10-CM | POA: Diagnosis present

## 2024-01-02 DIAGNOSIS — I729 Aneurysm of unspecified site: Secondary | ICD-10-CM

## 2024-01-02 HISTORY — DX: Aneurysm of unspecified site: I72.9

## 2024-01-02 MED ORDER — IOHEXOL 300 MG/ML  SOLN
100.0000 mL | Freq: Once | INTRAMUSCULAR | Status: AC | PRN
  Administered 2024-01-02: 100 mL via INTRAVENOUS

## 2024-01-02 NOTE — Progress Notes (Signed)
 Hi Ann ,  Can you please send the recent CT report to the PCP   ==========  Hi Tanya ,  Can you please call the patient and tell the CT scan did not show any evidence of malignancy which is a good news  Although it shows iliac artery aneurysm which is dilation of a localized areas of the artery. It is not big (>3.5cm) so may require serial imaging , we are forwarding the report to the PCP and you can continue to follow with your PCP regarding this  I look forward to see you tomorrow for upper endoscopy and colonoscopy   Thanks,  Nezzie Manera Faizan Kinisha Soper, MD Gastroenterology and Hepatology [facility] Gastroenterology ===============  CT IMPRESSION: No acute findings.  No radiographic evidence of malignancy.   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

## 2024-01-03 ENCOUNTER — Ambulatory Visit (HOSPITAL_COMMUNITY)
Admission: RE | Admit: 2024-01-03 | Discharge: 2024-01-03 | Disposition: A | Discharge status: Home / Self Care | Attending: Gastroenterology | Admitting: Gastroenterology

## 2024-01-03 ENCOUNTER — Ambulatory Visit (HOSPITAL_COMMUNITY): Admitting: Certified Registered"

## 2024-01-03 ENCOUNTER — Encounter (HOSPITAL_COMMUNITY): Payer: Self-pay | Admitting: Gastroenterology

## 2024-01-03 ENCOUNTER — Encounter (HOSPITAL_COMMUNITY)
Admission: RE | Disposition: A | Discharge status: Home / Self Care | Payer: Self-pay | Source: Home / Self Care | Attending: Gastroenterology

## 2024-01-03 DIAGNOSIS — D123 Benign neoplasm of transverse colon: Secondary | ICD-10-CM

## 2024-01-03 DIAGNOSIS — K2970 Gastritis, unspecified, without bleeding: Secondary | ICD-10-CM | POA: Insufficient documentation

## 2024-01-03 DIAGNOSIS — K9589 Other complications of other bariatric procedure: Secondary | ICD-10-CM

## 2024-01-03 DIAGNOSIS — K648 Other hemorrhoids: Secondary | ICD-10-CM | POA: Diagnosis not present

## 2024-01-03 DIAGNOSIS — T182XXA Foreign body in stomach, initial encounter: Secondary | ICD-10-CM | POA: Diagnosis not present

## 2024-01-03 DIAGNOSIS — Z980 Intestinal bypass and anastomosis status: Secondary | ICD-10-CM | POA: Diagnosis not present

## 2024-01-03 DIAGNOSIS — K3189 Other diseases of stomach and duodenum: Secondary | ICD-10-CM

## 2024-01-03 DIAGNOSIS — R6881 Early satiety: Secondary | ICD-10-CM | POA: Diagnosis not present

## 2024-01-03 DIAGNOSIS — Z9884 Bariatric surgery status: Secondary | ICD-10-CM | POA: Insufficient documentation

## 2024-01-03 DIAGNOSIS — R634 Abnormal weight loss: Secondary | ICD-10-CM

## 2024-01-03 DIAGNOSIS — K529 Noninfective gastroenteritis and colitis, unspecified: Secondary | ICD-10-CM | POA: Insufficient documentation

## 2024-01-03 HISTORY — PX: ESOPHAGOGASTRODUODENOSCOPY: SHX5428

## 2024-01-03 HISTORY — PX: COLONOSCOPY: SHX5424

## 2024-01-03 LAB — HM COLONOSCOPY

## 2024-01-03 SURGERY — COLONOSCOPY
Anesthesia: General

## 2024-01-03 MED ORDER — PROPOFOL 10 MG/ML IV BOLUS
INTRAVENOUS | Status: DC | PRN
  Administered 2024-01-03: 150 ug/kg/min via INTRAVENOUS
  Administered 2024-01-03: 100 mg via INTRAVENOUS

## 2024-01-03 MED ORDER — LACTATED RINGERS IV SOLN
INTRAVENOUS | Status: DC
Start: 2024-01-03 — End: 2024-01-03

## 2024-01-03 MED ORDER — SODIUM CHLORIDE (PF) 0.9 % IJ SOLN
INTRAMUSCULAR | Status: DC | PRN
  Administered 2024-01-03: 6 mL via INTRAVENOUS

## 2024-01-03 MED ORDER — LIDOCAINE HCL (CARDIAC) PF 100 MG/5ML IV SOSY
PREFILLED_SYRINGE | INTRAVENOUS | Status: DC | PRN
  Administered 2024-01-03: 50 mg via INTRAVENOUS

## 2024-01-03 NOTE — Anesthesia Postprocedure Evaluation (Signed)
 Anesthesia Post Note  Patient: [name]  Procedure(s) Performed: COLONOSCOPY EGD (ESOPHAGOGASTRODUODENOSCOPY)  Patient location during evaluation: PACU Anesthesia Type: General Level of consciousness: awake and alert Pain management: pain level controlled Vital Signs Assessment: post-procedure vital signs reviewed and stable Respiratory status: spontaneous breathing, nonlabored ventilation, respiratory function stable and patient connected to nasal cannula oxygen Cardiovascular status: blood pressure returned to baseline and stable Postop Assessment: no apparent nausea or vomiting Anesthetic complications: no   No notable events documented.   Last Vitals:  Vitals:   01/03/24 1034 01/03/24 1145  BP: (!) 188/72 121/76  Pulse: (!) 58 62  Resp: 18 16  Temp: 36.9 C 36.7 C  SpO2: 99% 96%    Last Pain:  Vitals:   01/03/24 1145  TempSrc: Oral  PainSc: 0-No pain                 Andrea Limes

## 2024-01-03 NOTE — Discharge Instructions (Signed)
   Discharge instructions Please read the instructions outlined below and refer to this sheet in the next few weeks. These discharge instructions provide you with general information on caring for yourself after you leave the hospital. Your doctor may also give you specific instructions. While your treatment has been planned according to the most current medical practices available, unavoidable complications occasionally occur. If you have any problems or questions after discharge, please call your doctor. ACTIVITY You may resume your regular activity but move at a slower pace for the next 24 hours.  Take frequent rest periods for the next 24 hours.  Walking will help expel (get rid of) the air and reduce the bloated feeling in your abdomen.  No driving for 24 hours (because of the anesthesia (medicine) used during the test).  You may shower.  Do not sign any important legal documents or operate any machinery for 24 hours (because of the anesthesia used during the test).  NUTRITION Drink plenty of fluids.  You may resume your normal diet.  Begin with a light meal and progress to your normal diet.  Avoid alcoholic beverages for 24 hours or as instructed by your caregiver.  MEDICATIONS You may resume your normal medications unless your caregiver tells you otherwise.  WHAT YOU CAN EXPECT TODAY You may experience abdominal discomfort such as a feeling of fullness or "gas" pains.  FOLLOW-UP Your doctor will discuss the results of your test with you.  SEEK IMMEDIATE MEDICAL ATTENTION IF ANY OF THE FOLLOWING OCCUR: Excessive nausea (feeling sick to your stomach) and/or vomiting.  Severe abdominal pain and distention (swelling).  Trouble swallowing.  Temperature over 101 F (37.8 C).  Rectal bleeding or vomiting of blood.    Avoid using high dose aspirin including Goody/BC powders, NSAIDs such as Aleve, ibuprofen, naproxen, Motrin, Voltaren or Advil (even the topical ones)    I hope you have a  great rest of your week!   Kaceton Vieau Faizan Ayushi Pla , M.D.. Gastroenterology and Hepatology [facility]

## 2024-01-03 NOTE — Op Note (Addendum)
 [facility] Patient Name: [name] Procedure Date: 01/03/2024 10:46 AM MRN: [id number] Date of Birth: 1955-09-13 Attending MD: Deatrice Dine , MD, [id number] CSN: [id number] Age: 68 Admit Type: Outpatient Procedure:                Upper GI endoscopy Indications:              Early satiety, Weight loss Providers:                Deatrice Dine, MD, Madelin Hunter, RN, Jon LABOR.                            Gerome RN, RN Referring MD:              Medicines:                Monitored Anesthesia Care Complications:            No immediate complications. Estimated Blood Loss:     Estimated blood loss was minimal. Procedure:                After obtaining informed consent, the endoscope was                            passed under direct vision. Throughout the                            procedure, the patient's blood pressure, pulse, and                            oxygen saturations were monitored continuously. The                            [id number] ([id number]) Upper was introduced through                            the mouth, and advanced to the anastomosis site of                            gastric bypass. The upper GI endoscopy was                            accomplished without difficulty. The patient                            tolerated the procedure well. Scope In: 11:02:51 AM Scope Out: 11:11:14 AM Total Procedure Duration: 0 hours 8 minutes 23 seconds  Findings:      The examined esophagus was normal.      Evidence of a gastric bypass was found in the anastomosis. This was       characterized by erythema and visible sutures. Biopsies were taken with       a cold forceps for histology.      Evidence of a previous surgical anastomosis was found in the stomach.       This was characterized by erosion and erythema. Biopsies were taken with       a cold forceps for histology.      Exam of the jejunum was  otherwise normal. Impression:               - Normal esophagus.                            - Evidence of gastric surgery was found,                            characterized by erythema and visible sutures.                            Biopsied.                           - A previous surgical anastomosis was found,                            characterized by erosion and erythema. Biopsied.                           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 The signs and symptoms of potential                            delayed complications were discussed with the                            patient. Return to normal activities tomorrow.                            Written discharge instructions were provided to the                            patient.                           - Resume previous diet.                           - Continue present medications.                           - Await pathology results.                           -Follow up with Bariatric surgeon given findings                            and continued symptoms . Procedure Code(s):        --- Professional  ---                           [phone], Esophagogastroduodenoscopy, flexible,                            transoral; with biopsy, single or multiple Diagnosis Code(s):        --- Professional ---                           Z98.84, Bariatric surgery status                           Z98.0, Intestinal bypass and anastomosis status                           R68.81, Early satiety                           R63.4, Abnormal weight loss CPT copyright 2022 American Medical Association. All rights reserved. The codes documented in this report are preliminary and upon coder review may  be revised to meet current compliance requirements. Deatrice Dine, MD Deatrice Dine, MD 01/03/2024 11:49:31 AM This report has been signed electronically. Number of Addenda: 0

## 2024-01-03 NOTE — Transfer of Care (Signed)
 Immediate Anesthesia Transfer of Care Note  Patient: [name]  Procedure(s) Performed: COLONOSCOPY EGD (ESOPHAGOGASTRODUODENOSCOPY)  Patient Location: Short Stay  Anesthesia Type:General  Level of Consciousness: awake  Airway & Oxygen Therapy: Patient Spontanous Breathing  Post-op Assessment: Report given to RN  Post vital signs: Reviewed and stable  Last Vitals:  Vitals Value Taken Time  BP    Temp    Pulse    Resp    SpO2      Last Pain:  Vitals:   01/03/24 1053  TempSrc:   PainSc: 0-No pain      Patients Stated Pain Goal: 7 (01/03/24 1034)  Complications: No notable events documented.

## 2024-01-03 NOTE — Anesthesia Preprocedure Evaluation (Addendum)
 Anesthesia Evaluation  Patient identified by MRN, date of birth, ID band Patient awake    Reviewed: Allergy & Precautions, H&P , NPO status , Patient's Chart, lab work & pertinent test results  Airway Mallampati: I  TM Distance: >3 FB Neck ROM: Full    Dental no notable dental hx.    Pulmonary neg pulmonary ROS   Pulmonary exam normal breath sounds clear to auscultation       Cardiovascular negative cardio ROS Normal cardiovascular exam Rhythm:Regular Rate:Normal     Neuro/Psych   Anxiety     negative neurological ROS  negative psych ROS   GI/Hepatic negative GI ROS, Neg liver ROS,,,  Endo/Other  Hypothyroidism    Renal/GU negative Renal ROS  negative genitourinary   Musculoskeletal  (+) Arthritis ,    Abdominal   Peds negative pediatric ROS (+)  Hematology negative hematology ROS (+)   Anesthesia Other Findings Recent ct shows new iliac artery aneurysm measuring 1.9 cm Pt to follow up  Reproductive/Obstetrics negative OB ROS                              Anesthesia Physical Anesthesia Plan  ASA: 2  Anesthesia Plan: General   Post-op Pain Management:    Induction: Intravenous  PONV Risk Score and Plan:   Airway Management Planned: Nasal Cannula  Additional Equipment:   Intra-op Plan:   Post-operative Plan:   Informed Consent: I have reviewed the patients History and Physical, chart, labs and discussed the procedure including the risks, benefits and alternatives for the proposed anesthesia with the patient or authorized representative who has indicated his/her understanding and acceptance.     Dental advisory given  Plan Discussed with: CRNA  Anesthesia Plan Comments:          Anesthesia Quick Evaluation

## 2024-01-03 NOTE — Op Note (Signed)
 [facility] Patient Name: [name] Procedure Date: 01/03/2024 10:45 AM MRN: [id number] Date of Birth: 1955/07/14 Attending MD: Deatrice Dine , MD, [id number] CSN: [id number] Age: 68 Admit Type: Outpatient Procedure:                Colonoscopy Indications:              Chronic diarrhea, Weight loss Providers:                Deatrice Dine, MD, Madelin Hunter, RN, Jon LABOR.                            Gerome RN, RN Referring MD:              Medicines:                Monitored Anesthesia Care Complications:            No immediate complications. Estimated Blood Loss:     Estimated blood loss: none. Estimated blood loss                            was minimal. Procedure:                Pre-Anesthesia Assessment:                           - Prior to the procedure, a History and Physical                            was performed, and patient medications and                            allergies were reviewed. The patient's tolerance of                            previous anesthesia was also reviewed. The risks                            and benefits of the procedure and the sedation                            options and risks were discussed with the patient.                            All questions were answered, and informed consent                            was obtained. Prior Anticoagulants: The patient has                            taken no anticoagulant or antiplatelet agents                            except for aspirin . ASA Grade Assessment: III - A  patient with severe systemic disease. After                            reviewing the risks and benefits, the patient was                            deemed in satisfactory condition to undergo the                            procedure.                           After obtaining informed consent, the colonoscope                            was passed under direct vision. Throughout the                             procedure, the patient's blood pressure, pulse, and                            oxygen saturations were monitored continuously. The                            CH-HQ190L ([id number]) Colon was introduced through                            the anus and advanced to the the cecum, identified                            by appendiceal orifice and ileocecal valve. The                            colonoscopy was performed without difficulty. The                            patient tolerated the procedure well. The quality                            of the bowel preparation was evaluated using the                            BBPS [facility] Bowel Preparation Scale) with scores                            of: Right Colon = 3, Transverse Colon = 3 and Left                            Colon = 3 (entire mucosa seen well with no residual                            staining, small fragments of stool or opaque  liquid). The total BBPS score equals 9. Scope In: 11:15:40 AM Scope Out: 11:36:54 AM Scope Withdrawal Time: 0 hours 17 minutes 18 seconds  Total Procedure Duration: 0 hours 21 minutes 14 seconds  Findings:      The perianal and digital rectal examinations were normal.      A 15 mm polyp was found in the transverse colon. The polyp was sessile.       Preparations were made for mucosal resection. Demarcation of the lesion       was performed with high-definition white light and narrow band imaging       to clearly identify the boundaries of the lesion. Saline was injected to       raise the lesion. Snare mucosal resection was performed. Resection and       retrieval were complete. Resected tissue margins were examined and clear       of polyp tissue.      There is no endoscopic evidence of inflammation in the entire colon.       Biopsies for histology were taken with a cold forceps for evaluation of       microscopic colitis.      Non-bleeding internal hemorrhoids were  found during retroflexion. The       hemorrhoids were small. Impression:               - One 15 mm polyp in the transverse colon, removed                            with mucosal resection. Resected and retrieved.                           - Non-bleeding internal hemorrhoids.                           - Mucosal resection was performed. Resection and                            retrieval were complete. Moderate Sedation:      Per Anesthesia Care Recommendation:           - Patient has a contact number available for                            emergencies. The signs and symptoms of potential                            delayed complications were discussed with the                            patient. Return to normal activities tomorrow.                            Written discharge instructions were provided to the                            patient.                           - Resume previous diet.                           -  Continue present medications.                           - Repeat colonoscopy in 3 years for surveillance                            based on pathology results; if medically fit Procedure Code(s):        --- Professional ---                           [phone], Colonoscopy, flexible; with endoscopic                            mucosal resection                           45380, 59, Colonoscopy, flexible; with biopsy,                            single or multiple Diagnosis Code(s):        --- Professional ---                           D12.3, Benign neoplasm of transverse colon (hepatic                            flexure or splenic flexure)                           K64.8, Other hemorrhoids                           K52.9, Noninfective gastroenteritis and colitis,                            unspecified                           R63.4, Abnormal weight loss CPT copyright 2022 American Medical Association. All rights reserved. The codes documented in this report are preliminary  and upon coder review may  be revised to meet current compliance requirements. Deatrice Dine, MD Deatrice Dine, MD 01/03/2024 12:02:19 PM This report has been signed electronically. Number of Addenda: 0

## 2024-01-03 NOTE — Progress Notes (Signed)
report faxed to PCP  

## 2024-01-03 NOTE — H&P (Signed)
 Primary Care Physician:  Trudy Vaughn FALCON, MD Primary Gastroenterologist:  Dr. Cinderella  Pre-Procedure History & Physical: HPI: [name] is a 68 y.o. male with arthritis, DM, previous gastric bypass surgery, and multiple hernia repairs. who presents for evaluation of  Unintentional weight loss. Altered Bowel habits   Patient was last seen by GI in 2023 for elevated liver enzymes.  Patient reports that since December 2024 he has lost 40 pounds which is unintentional.  Patient reports loss of appetite which is a new symptom.  Recently his bowel movement has been altered where he would have mostly liquid stool with urgency The patient denies having any nausea, vomiting, fever, chills, hematochezia, melena, hematemesis, abdominal distention, abdominal pain, jaundice, pruritus    Last Colonoscopy:09/30/13 normal except small external hemorrhoids Last Endoscopy:03/10/14 focal gastritis noted involving proximal segment of stomach but gastric ulcer has complete healed, altered UGI tract secondary to remote gastric bypass surgery **09/30/13 gastric ulcer involving proximal stomach, most likely secondary to NSAID use, altered UGI tract secondary to prior surgery.   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   Anxiety    Arthritis    Edema    Hypothyroidism    Obesity     Past Surgical History:  Procedure Laterality Date   ABDOMINOPLASTY/PANNICULECTOMY     BARIATRIC SURGERY     2002   COLONOSCOPY N/A 09/30/2013   Procedure: COLONOSCOPY;  Surgeon: Claudis RAYMOND Rivet, MD;  [facility];  Service: Endoscopy;  Laterality: N/A;  100-moved to 115 Ann to notify pt   ESOPHAGOGASTRODUODENOSCOPY N/A 09/30/2013   Procedure: ESOPHAGOGASTRODUODENOSCOPY (EGD);  Surgeon: Claudis RAYMOND Rivet, MD;  [facility];   Service: Endoscopy;  Laterality: N/A;   ESOPHAGOGASTRODUODENOSCOPY N/A 03/10/2014   Procedure: ESOPHAGOGASTRODUODENOSCOPY (EGD);  Surgeon: Claudis RAYMOND Rivet, MD;  [facility];  Service: Endoscopy;  Laterality: N/A;  100   HERNIA REPAIR Left 01/13/2014   incisional herni and LIH- had total of 6 hernia    Prior to Admission medications   Medication Sig Start Date End Date Taking? Authorizing Provider  aspirin  EC 81 MG tablet Take 81 mg by mouth daily. Swallow whole.   Yes [provider]  levothyroxine (SYNTHROID) 50 MCG tablet Take 50 mcg by mouth daily before breakfast. 06/06/17  Yes [provider]  Multiple Vitamin (MULTI-VITAMINS) TABS Take by mouth. 2 daily - gummies   Yes [provider]  potassium chloride (K-DUR) 10 MEQ tablet Take 20 mEq by mouth. qid Patient taking differently: Take 40 mEq by mouth QID. qid 08/09/13  Yes [provider]  sertraline (ZOLOFT) 100 MG tablet Take 100 mg by mouth 2 (two) times daily. 06/06/17  Yes [provider]  UNABLE TO FIND Med Name: Vitamin A,E,D   Yes [provider]    Allergies as of 11/26/2023 - Review Complete 11/25/2023  Allergen Reaction Noted   Vancomycin Diarrhea 03/02/2014   Brompheniramine-phenylephrine Dermatitis and Hives 01/06/2014   Tomato Hives, Itching, and Rash 03/02/2014    Family History  Problem Relation Age of Onset   Inflammatory bowel disease Maternal Uncle     Social History   Socioeconomic History   Marital status: Married    Spouse name: Not on file   Number of children: Not on file   Years  of education: Not on file   Highest education level: Not on file  Occupational History   Not on file  Tobacco Use   Smoking status: Never   Smokeless tobacco: Never  Vaping Use   Vaping status: Never Used  Substance and Sexual Activity   Alcohol use: No   Drug use: No   Sexual activity: Not on file  Other Topics Concern   Not on file  Social History  Narrative   Not on file   Social Drivers of Health   Financial Resource Strain: Not on file  Food Insecurity: Not on file  Transportation Needs: Not on file  Physical Activity: Not on file  Stress: Not on file  Social Connections: Not on file  Intimate Partner Violence: Not on file    Review of Systems: See HPI, otherwise negative ROS  Physical Exam: Vital signs in last 24 hours: Temp:  [98.4 F (36.9 C)] 98.4 F (36.9 C) (10/31 1034) Pulse Rate:  [58] 58 (10/31 1034) Resp:  [18] 18 (10/31 1034) BP: (188)/(72) 188/72 (10/31 1034) SpO2:  [99 %] 99 % (10/31 1034)   General:   Alert,  Well-developed, well-nourished, pleasant and cooperative in NAD Head:  Normocephalic and atraumatic. Eyes:  Sclera clear, no icterus.   Conjunctiva pink. Ears:  Normal auditory acuity. Nose:  No deformity, discharge,  or lesions. Msk:  Symmetrical without gross deformities. Normal posture. Extremities:  Without clubbing or edema. Neurologic:  Alert and  oriented x4;  grossly normal neurologically. Skin:  Intact without significant lesions or rashes. Psych:  Alert and cooperative. Normal mood and affect.  Impression/Plan: [name] is a 68 y.o. male with arthritis, DM, previous gastric bypass surgery, and multiple hernia repairs. who presents for evaluation of  Unintentional weight loss. Altered Bowel habits    Proceed with upper endoscopy and colonoscopy   The risks of the procedure including infection, bleed, or perforation as well as benefits, limitations, alternatives and imponderables have been reviewed with the patient. Questions have been answered. All parties agreeable.

## 2024-01-06 ENCOUNTER — Encounter (HOSPITAL_COMMUNITY): Payer: Self-pay | Admitting: Gastroenterology

## 2024-01-06 ENCOUNTER — Encounter (INDEPENDENT_AMBULATORY_CARE_PROVIDER_SITE_OTHER): Payer: Self-pay | Admitting: *Deleted

## 2024-01-06 LAB — SURGICAL PATHOLOGY

## 2024-01-08 ENCOUNTER — Ambulatory Visit (INDEPENDENT_AMBULATORY_CARE_PROVIDER_SITE_OTHER): Payer: Self-pay | Admitting: Gastroenterology

## 2024-01-08 NOTE — Progress Notes (Signed)
 3 yr TCS noted in recall Patient result letter mailed procedure note and pathology result faxed to PCP

## 2024-02-03 NOTE — Progress Notes (Unsigned)
 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  This was identified on CT abdomen pelvis from 01/02/2024 and ordered by GI in [facility].  Patient states he wanted to make sure he did not have any malignancy given his history of gastric bypass.  Denies any abdominal or back pain.  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   Anxiety    Arthritis    Edema    Hypothyroidism    Obesity     Past Surgical History:  Procedure Laterality Date   ABDOMINOPLASTY/PANNICULECTOMY     BARIATRIC SURGERY     2002   COLONOSCOPY N/A 09/30/2013   Procedure: COLONOSCOPY;  Surgeon: Claudis RAYMOND Rivet, MD;  [facility];  Service: Endoscopy;  Laterality: N/A;  100-moved to 115 Ann to notify pt   COLONOSCOPY N/A 01/03/2024   Procedure: COLONOSCOPY;  Surgeon: Cinderella Deatrice FALCON, MD;  [facility];  Service: Endoscopy;  Laterality: N/A;  11:45am, ASA 3   ESOPHAGOGASTRODUODENOSCOPY N/A 09/30/2013   Procedure: ESOPHAGOGASTRODUODENOSCOPY (EGD);  Surgeon: Claudis RAYMOND Rivet, MD;  [facility];  Service: Endoscopy;  Laterality: N/A;   ESOPHAGOGASTRODUODENOSCOPY N/A 03/10/2014   Procedure: ESOPHAGOGASTRODUODENOSCOPY (EGD);  Surgeon: Claudis RAYMOND Rivet, MD;  [facility];  Service: Endoscopy;  Laterality: N/A;  100   ESOPHAGOGASTRODUODENOSCOPY N/A 01/03/2024   Procedure: EGD (ESOPHAGOGASTRODUODENOSCOPY);  Surgeon: Cinderella Deatrice FALCON, MD;  [facility];  Service: Endoscopy;  Laterality: N/A;   HERNIA REPAIR Left 01/13/2014   incisional herni and LIH- had total of 6 hernia    Family History  Problem Relation Age of Onset   Inflammatory bowel disease Maternal Uncle     SOCIAL  HISTORY: Social History   Socioeconomic History   Marital status: Married    Spouse name: Not on file   Number of children: Not on file   Years of education: Not on file   Highest education level: Not on file  Occupational History   Not on file  Tobacco Use   Smoking status: Never   Smokeless tobacco: Never  Vaping Use   Vaping status: Never Used  Substance and Sexual Activity   Alcohol use: No   Drug use: No   Sexual activity: Not on file  Other Topics Concern   Not on file  Social History Narrative   Not on file   Social Drivers of Health   Financial Resource Strain: Not on file  Food Insecurity: Not on file  Transportation Needs: Not on file  Physical Activity: Not on file  Stress: Not on file  Social Connections: Not on file  Intimate Partner Violence: Not on file    Allergies  Allergen Reactions   Levaquin [Levofloxacin] Diarrhea   Vancomycin Diarrhea    IV Vancomycin   Brompheniramine-Phenylephrine Dermatitis and Hives   Tomato Hives, Itching and Rash    Raw Tomato    Current Outpatient Medications  Medication Sig Dispense Refill   aspirin  EC 81 MG tablet Take 81 mg by mouth daily. Swallow whole.     levothyroxine (SYNTHROID) 50 MCG tablet Take 50 mcg by mouth daily before breakfast.  12   Multiple Vitamin (MULTI-VITAMINS) TABS Take by mouth.  2 daily - gummies     potassium chloride (K-DUR) 10 MEQ tablet Take 20 mEq by mouth. qid (Patient taking differently: Take 40 mEq by mouth QID. qid)     sertraline (ZOLOFT) 100 MG tablet Take 100 mg by mouth 2 (two) times daily.  12   UNABLE TO FIND Med Name: Vitamin A,E,D     No current facility-administered medications for this visit.    REVIEW OF SYSTEMS:  [X]  denotes positive finding, [ ]  denotes negative finding Cardiac  Comments:  Chest pain or chest pressure:    Shortness of breath upon exertion:    Short of breath when lying flat:    Irregular heart rhythm:        Vascular    Pain in calf, thigh,  or hip brought on by ambulation:    Pain in feet at night that wakes you up from your sleep:     Blood clot in your veins:    Leg swelling:         Pulmonary    Oxygen at home:    Productive cough:     Wheezing:         Neurologic    Sudden weakness in arms or legs:     Sudden numbness in arms or legs:     Sudden onset of difficulty speaking or slurred speech:    Temporary loss of vision in one eye:     Problems with dizziness:         Gastrointestinal    Blood in stool:     Vomited blood:         Genitourinary    Burning when urinating:     Blood in urine:        Psychiatric    Major depression:         Hematologic    Bleeding problems:    Problems with blood clotting too easily:        Skin    Rashes or ulcers:        Constitutional    Fever or chills:      PHYSICAL EXAM: There were no vitals filed for this visit.  GENERAL: The patient is a well-nourished male, in no acute distress. The vital signs are documented above. CARDIAC: There is a regular rate and rhythm.  VASCULAR:  Bilateral femoral pulses palpable Bilateral DP pulses palpable PULMONARY: No respiratory distress. ABDOMEN: Soft and non-tender. MUSCULOSKELETAL: There are no major deformities or cyanosis. NEUROLOGIC: No focal weakness or paresthesias are detected. SKIN: There are no ulcers or rashes noted. PSYCHIATRIC: The patient has a normal affect.  DATA:   CT abdomen pelvis reviewed 01/02/2024 with 1.9 cm ectasia versus aneurysm of the right common iliac artery  Assessment/Plan:   68 y.o. 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  This was identified on CT abdomen pelvis from 01/02/2024 and ordered by GI in [facility]  I discussed would recommend continued surveillance and nonsurgical management at this time.  I discussed for common iliac artery aneurysms we typically recommend repair greater than 3 to 3.5 cm given  risk of rupture when they enlarge.  Will follow-up with duplex in 1 year.  This is really on the smaller side as we discussed today and I do not have any immediate concerns   Lonni DOROTHA Gaskins, MD [facility]: [phone]

## 2024-02-04 ENCOUNTER — Encounter: Payer: Self-pay | Admitting: Vascular Surgery

## 2024-02-04 ENCOUNTER — Ambulatory Visit: Admitting: Vascular Surgery

## 2024-02-04 VITALS — BP 174/93 | HR 61 | Resp 18 | Ht 69.0 in | Wt 228.0 lb

## 2024-02-04 DIAGNOSIS — I723 Aneurysm of iliac artery: Secondary | ICD-10-CM | POA: Diagnosis not present

## 2024-04-10 ENCOUNTER — Encounter (INDEPENDENT_AMBULATORY_CARE_PROVIDER_SITE_OTHER): Payer: Self-pay | Admitting: Gastroenterology
